# Patient Record
Sex: Female | Born: 1965 | Race: White | Hispanic: No | Marital: Married | Smoking: Never smoker
Health system: Southern US, Community
[De-identification: ages and names within clinical notes are randomized; demographics above are authoritative.]

## PROBLEM LIST (undated history)

## (undated) DIAGNOSIS — K5900 Constipation, unspecified: Secondary | ICD-10-CM

## (undated) DIAGNOSIS — K625 Hemorrhage of anus and rectum: Secondary | ICD-10-CM

## (undated) DIAGNOSIS — R109 Unspecified abdominal pain: Secondary | ICD-10-CM

## (undated) DIAGNOSIS — M6790 Unspecified disorder of synovium and tendon, unspecified site: Secondary | ICD-10-CM

## (undated) HISTORY — DX: Constipation, unspecified: K59.00

## (undated) HISTORY — DX: Unspecified abdominal pain: R10.9

## (undated) HISTORY — DX: Hemorrhage of anus and rectum: K62.5

---

## 2011-02-14 ENCOUNTER — Ambulatory Visit: Payer: Self-pay | Admitting: Orthopedic Surgery

## 2011-02-14 ENCOUNTER — Ambulatory Visit: Payer: Self-pay

## 2011-02-14 NOTE — Progress Notes (Signed)
 CHIEF COMPLAINT:   Left foot pain.    HISTORY OF PRESENT ILLNESS:   The patient is a pleasant 45 year old female  who is very active with marathon training long distance biking who states  she has had left foot pain since this past summer.  The patient completed  activity modification on her own as well as some shoe wear modifications.  Had some resolution of her symptoms; however over the last 2 weeks the  patient has had increase, which is affecting her activities of daily  living.    Past medical history, past surgical history, medications, allergies, social  history, family history, and review of systems are all discussed reviewed  with the patient utilizing the new patient questionnaire. Overall the  patient is very healthy taking nonsteroidals on a p.r.n. basis with no  allergies.    PHYSICAL EXAMINATION:  The patient is alert and oriented appropriately in  no apparent distress.  The patient is approximately 5 feet 6 inches tall  weighing 120 pounds.  Focused exam to the lower extremities notes overall  good standing alignment.  The patient does have some mild cavus.  Sensation  about L4, L4, and S1 intact and symmetrical.  Palpable pulses of DP.  Motor  is 5/5 in all major muscle groups.  Range of motion of the ankle and  subtalar are equal and without pain elicited.  Mild tenderness to palpation  about the middle aspect of the plantar fascia along with significant  tenderness to palpation over the fibular sesamoid on the patient's left  foot is noted.    IMAGING STUDIES:  Review of weightbearing x-rays of the left foot taken the  office today demonstrate no acute fracture, subluxation, or dislocations  noted.  No significant arthritic change.  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    ASSESSMENT:  Fibular sesamoiditis.    PLAN:  The patient has not received any significant treatment for this  condition.  We will start her today with inserts  with posting about that  fibular sesamoid.  I have also briefed the patient about activity  modification, icing as well as nonsteroidal use.  We will have her follow  back up on a p.r.n. basis.  We did discuss with the patient that if she has  an acute exacerbation especially in light of all her training and  activities she currently participates in, consideration for use of a  fracture boot might be appropriate.      Dictated by:  Katherine Basset, MD,FEL      I saw and evaluated the patient.  I agree with the resident's/fellow's  findings and plan of care as documented above.    Electronically Signed and Finalized  by  Halina Andreas, MD 02/15/2011  17:39  ___________________________________________  Halina Andreas, MD      DD:   02/14/2011  DT:   02/14/2011  2:33 P  ZO/[id number]  [id number]    cc:

## 2011-02-20 ENCOUNTER — Ambulatory Visit: Payer: Self-pay

## 2012-06-27 ENCOUNTER — Encounter: Payer: Self-pay | Admitting: Gastroenterology

## 2012-06-27 LAB — HM MAMMOGRAPHY

## 2012-06-27 LAB — HM COLONOSCOPY

## 2012-07-01 ENCOUNTER — Ambulatory Visit: Payer: Self-pay | Admitting: Primary Care

## 2012-07-01 ENCOUNTER — Encounter: Payer: Self-pay | Admitting: Primary Care

## 2012-07-01 VITALS — BP 98/68 | HR 62 | Ht 64.75 in | Wt 119.4 lb

## 2012-07-01 DIAGNOSIS — K921 Melena: Secondary | ICD-10-CM

## 2012-07-01 DIAGNOSIS — R102 Pelvic and perineal pain: Secondary | ICD-10-CM

## 2012-07-01 NOTE — Progress Notes (Signed)
SUBJECTIVE:  New pt.      Having pain after intercourse in her pelvis.  Some bloating. .Lipoma may be growing.  Tends to be constipated; tries to manage it with fiber and water. Has taken MiraLax occasionally.  Has had blood in the stool occasionally in small amounts in the last year.  No identifiable hemorrhoids.      Some increased stress. Manages stress and anxiety with exercise. Denies insomnia.  Mood is mostly good. Denies anhedonia.    Exercises regularly with running. No exertional dyspnea or chest pain. No dizzy spells. No skin rashes. Review of systems otherwise negative.      family history includes Alcohol abuse in her paternal grandfather; Cancer in her maternal aunt; Cancer (age of onset: 20) in her mother; Cancer (age of onset: 3) in her maternal grandmother; Depression in her maternal uncle and mother; and Heart disease (age of onset: 72) in her father.     reports that she has never smoked. She does not have any smokeless tobacco history on file. She reports that  drinks alcohol. She reports that she does not use illicit drugs.            Allergies:  Adhesive tape    Meds:  No current outpatient prescriptions on file.  Medication list reviewed and updated today during the visit.    OBJECTIVE:      BP 98/68  Pulse 62  Ht 1.645 m (5' 4.75")  Wt 54.159 kg (119 lb 6.4 oz)  BMI 20.01 kg/m2  LMP 07/01/2012    Well appearing middle-aged woman in no acute distress. HEENT exam revealed anicteric sclerae, moist mucous membranes, no oral lesions.  Neck is supple without lymphadenopathy or thyromegaly. Lungs are clear to auscultation bilaterally with easy respirations. Normal S1-S2 at a regular rate and rhythm without murmurs rubs or gallops. JVP is less than 7 cm above the right atrium.  Abdomen soft and nontender nondistended. Extremities are without clubbing cyanosis or edema. Skin is warm and dry without rashes. Affect is normal. Speech is normal.  Rectal exam reveals no external masses. Brown stool in  the vault. No internal masses. Possible internal hemorrhoids palpable.    ASSESSMENT and PLAN:  1.  Pelvic pain: Not classically dyspareunia antibiotics or after intercourse not during. Check pelvic ultrasound to evaluate for fibroids. Schedule routine pelvic exam with Pap smear. Could be irritable bowel syndrome given constipation symptoms and bloating. Discussed stress management and dietary changes. Consider addition of fiber supplement.    2. Rectal bleeding: Likely hemorrhoidal but other pathology needs to be ruled out. Referred for colonoscopy. Possibly irritable bowel syndrome as discussed above.          Signed: Virgel Manifold, MD on 07/01/2012 at 11:44 AM  [facility]

## 2012-07-01 NOTE — Patient Instructions (Addendum)
Try Konsyl powder daily in water.  Add miralax if that's not enough to improve you bloating and constipation.      [facility]- [address]   Ph#: [phone]   Date: Mon 07/08/12  Time: 12:45pm  *32 ounces water 1hr before, hold.

## 2012-07-02 ENCOUNTER — Encounter: Payer: Self-pay | Admitting: Primary Care

## 2012-07-23 ENCOUNTER — Telehealth: Payer: Self-pay | Admitting: Primary Care

## 2012-07-23 NOTE — Telephone Encounter (Signed)
Pt calling back re: u/s results. Also, her mother had cancerous polyps removed at the age of 47yrs old (referring to colonoscopy referral). Thanks!

## 2012-08-02 ENCOUNTER — Telehealth: Payer: Self-pay | Admitting: Primary Care

## 2012-08-02 DIAGNOSIS — M79673 Pain in unspecified foot: Secondary | ICD-10-CM

## 2012-08-02 NOTE — Telephone Encounter (Signed)
Pt needs a orthotics RX for Lyncos. Thank you!  Fax #: [phone]

## 2012-08-05 NOTE — Telephone Encounter (Signed)
Yes- lyncos is a specific type pf foot support. Orthotics asked for a rx for them. Thanks!

## 2012-08-05 NOTE — Telephone Encounter (Signed)
Notified Pt to contact [facility].

## 2012-08-07 NOTE — Telephone Encounter (Signed)
Will sign and put in my outbox

## 2012-08-08 NOTE — Telephone Encounter (Signed)
Nicholaus Bloom sent Wed 10/16

## 2012-08-09 ENCOUNTER — Ambulatory Visit: Payer: Self-pay

## 2012-08-09 DIAGNOSIS — M79673 Pain in unspecified foot: Secondary | ICD-10-CM

## 2012-08-09 NOTE — Progress Notes (Signed)
Received 1 pr new [place] inserts.

## 2012-08-12 ENCOUNTER — Encounter: Payer: Self-pay | Admitting: Primary Care

## 2012-08-12 ENCOUNTER — Ambulatory Visit: Payer: Self-pay | Admitting: Primary Care

## 2012-08-12 VITALS — Ht 64.75 in | Wt 118.4 lb

## 2012-08-12 DIAGNOSIS — Z01419 Encounter for gynecological examination (general) (routine) without abnormal findings: Secondary | ICD-10-CM

## 2012-08-12 DIAGNOSIS — Z139 Encounter for screening, unspecified: Secondary | ICD-10-CM

## 2012-08-12 DIAGNOSIS — Z23 Encounter for immunization: Secondary | ICD-10-CM

## 2012-08-13 NOTE — H&P (Signed)
History and Physical    HISTORY:  Chief Complaint   Patient presents with   . Gynecologic Exam     looking for GYN         History of Present Illness:    HPI Comments: Feeling better. Pelvic pain has significantly improved with attention to constipation with increased exercise and water intake. Taking fiber supplements intermittently. Periods are pretty regular. No excessive bleeding. No bleeding in between periods. No vaginal discharge. No new sexual partners. Has a history of regular normal Pap smears. Last one was about 3 years ago before she moved here.    Gynecologic Exam  Pertinent negatives include no abdominal pain, constipation, diarrhea, nausea or vomiting.       Problems:  There is no problem list on file for this patient.       Past Medical/Surgical History:   No past medical history on file.  Past Surgical History   Procedure Laterality Date   . Cesarean section, classic       once, for twins   . Pelvic laparoscopy       for infertility         Allergies:    Allergies   Allergen Reactions   . Adhesive Tape Rash       Current medications:    No current outpatient prescriptions on file.       Family History:    Family History   Problem Relation Age of Onset   . Alcohol abuse Paternal Grandfather    . Cancer Mother 74     colon polyps--possibly   . Depression Mother    . Heart disease Father 57     smoker   . Cancer Maternal Aunt      colon polyps   . Depression Maternal Uncle      suicide   . Cancer Maternal Grandmother 6     fatal colon cancer       Social/Occupational History:   History     Social History   . Marital Status: Married     Spouse Name: N/A     Number of Children: N/A   . Years of Education: N/A     Social History Main Topics   . Smoking status: Never Smoker    . Smokeless tobacco: Never Used   . Alcohol Use: Yes      occasional   . Drug Use: No   . Sexually Active: Not on file     Other Topics Concern   . Not on file     Social History Narrative    Homemaker and active volunteer     Married with 3 children         Review of Systems:    Review of Systems   Constitutional: Negative.    HENT: Negative.    Eyes: Negative.    Respiratory: Negative.    Cardiovascular: Negative.    Gastrointestinal: Positive for blood in stool. Negative for heartburn, nausea, vomiting, abdominal pain, diarrhea, constipation and melena.        Occasional bright blood in the stool. Occurred the other day after running 17 miles. Constipation has improved with increased water and exercise.   Genitourinary: Negative.    Musculoskeletal: Negative.    Skin: Negative.    Neurological: Negative.    Endo/Heme/Allergies: Negative.    Psychiatric/Behavioral: Negative.        Vital Signs:   Ht 1.645 m (5' 4.75")  Wt 53.706 kg (118 lb 6.4  oz)  BMI 19.85 kg/m2  LMP 07/28/2012      PHYSICAL EXAM:  Physical Exam   Constitutional: She is oriented to person, place, and time. She appears well-developed and well-nourished. No distress.   HENT:   Head: Normocephalic and atraumatic.   Right Ear: External ear normal.   Left Ear: External ear normal.   Nose: Nose normal.   Mouth/Throat: Oropharynx is clear and moist. No oropharyngeal exudate.   Hearing intact to finger rubbing bilaterally   Eyes: Conjunctivae and EOM are normal. Pupils are equal, round, and reactive to light. No scleral icterus.   Neck: Normal range of motion. Neck supple. No JVD present. No tracheal deviation present. No thyromegaly present.   Cardiovascular: Normal rate, regular rhythm, normal heart sounds and intact distal pulses.  Exam reveals no gallop and no friction rub.    No murmur heard.  Pulmonary/Chest: Effort normal and breath sounds normal. No respiratory distress. She has no wheezes. She has no rales.   Breast exam normal.   Abdominal: Soft. Bowel sounds are normal. She exhibits no distension and no mass. There is no tenderness. There is no rebound and no guarding.   Genitourinary: Vagina normal and uterus normal. No vaginal discharge found.   Cervix appears  normal.   Musculoskeletal: She exhibits no edema.   Lymphadenopathy:     She has no cervical adenopathy.   Neurological: She is alert and oriented to person, place, and time. No cranial nerve deficit. She exhibits normal muscle tone. Coordination normal.   Babinski downgoing bilaterally  Vibration testing normal in the distal toes bilaterally.   Skin: Skin is warm and dry. No rash noted.   Psychiatric: She has a normal mood and affect. Her behavior is normal. Judgment and thought content normal.           Assessment:    Nyomii was seen today for gynecologic exam.    Diagnoses and associated orders for this visit:    Pap test, as part of routine gynecological examination  - GYN Cytology; Future  - GYN Cytology    Need for vaccination  - Flu vaccine Trivalent greater than or equal to 3yo with preservative IM (AMBULATORY USE ONLY)    Screening  - HIV 1&2 antigen/antibody; Future  - Lipid panel; Future    Rectal bleeding: Followup with GI. Likely related to internal hemorrhoids. However, given history of adenocarcinoma and a polyp in her mother, recommended colonoscopy.     .      Plan:        Immunizations discussed and updated.    Health care proxy reviewed.    Colon cancer screening discussed.    GYN care discussed.    Mammogram screening discussed.    Regular ophthalmology and dental care discussed.    HIV screening discussed.    Cardiovascular disease prevention discussed.    Osteoporosis screening and prevention discussed.

## 2012-08-19 LAB — HPV DNA PROBE WITH CYTOLOGY: HPV Hybrid Capture: NEGATIVE

## 2012-08-21 LAB — GYN CYTOLOGY

## 2012-09-12 ENCOUNTER — Encounter: Payer: Self-pay | Admitting: Gastroenterology

## 2012-09-25 ENCOUNTER — Encounter: Payer: Self-pay | Admitting: Gastroenterology

## 2012-09-30 ENCOUNTER — Ambulatory Visit
Admit: 2012-09-30 | Disposition: A | Discharge status: Home / Self Care | Payer: Self-pay | Source: Ambulatory Visit | Attending: Gastroenterology | Admitting: Gastroenterology

## 2012-11-15 ENCOUNTER — Encounter: Payer: Self-pay | Admitting: Gastroenterology

## 2012-11-20 ENCOUNTER — Telehealth: Payer: Self-pay

## 2012-11-20 NOTE — Telephone Encounter (Signed)
Called to remind patient of 11/21/2012 [facility] appointment however voicemail full at telephone number provided.

## 2012-11-21 ENCOUNTER — Ambulatory Visit
Admit: 2012-11-21 | Disposition: A | Discharge status: Home / Self Care | Payer: Self-pay | Source: Ambulatory Visit | Attending: Gastroenterology | Admitting: Gastroenterology

## 2012-11-21 ENCOUNTER — Encounter: Payer: Self-pay | Admitting: Gastroenterology

## 2012-11-21 MED ORDER — SODIUM CHLORIDE 0.9 % IV SOLN WRAPPED *I*
50.0000 mL/h | Status: DC
Start: 2012-11-21 — End: 2012-11-21
  Administered 2012-11-21 (×2): 50 mL/h via INTRAVENOUS

## 2012-11-21 MED ORDER — FENTANYL CITRATE 50 MCG/ML IJ SOLN *WRAPPED*
INTRAMUSCULAR | Status: AC | PRN
Start: 2012-11-21 — End: 2012-11-21
  Administered 2012-11-21: 75 ug via INTRAVENOUS
  Administered 2012-11-21: 25 ug via INTRAVENOUS

## 2012-11-21 MED ORDER — MIDAZOLAM HCL 5 MG/5ML IJ SOLN *I*
INTRAMUSCULAR | Status: AC
Start: 2012-11-21 — End: 2012-11-21
  Filled 2012-11-21: qty 5

## 2012-11-21 MED ORDER — FENTANYL CITRATE 50 MCG/ML IJ SOLN *WRAPPED*
INTRAMUSCULAR | Status: AC
Start: 2012-11-21 — End: 2012-11-21
  Filled 2012-11-21: qty 2

## 2012-11-21 MED ORDER — MIDAZOLAM HCL 1 MG/ML IJ SOLN *I* WRAPPED
INTRAMUSCULAR | Status: AC | PRN
Start: 2012-11-21 — End: 2012-11-21
  Administered 2012-11-21 (×2): 2 mg via INTRAVENOUS
  Administered 2012-11-21: 1 mg via INTRAVENOUS

## 2012-11-21 NOTE — Discharge Instructions (Signed)
[facility]  Discharge Instructions For Colonoscopy    11/21/2012    4:17 PM    Findings: Removal of two good-sized polyps, hemorrhoids    Do not drive, operate heavy machinery, drink alcoholic beverages, make important personal or business decisions, or sign legal documents until the next day.     Instructions:   Follow a high fiber diet.    Things You May Expect:   There may be a small amount of bright red blood in your stool.   It may be a few days before you have a bowel movement.   You may have cramping, bloating, and a "gas" feeling. These feelings should go away as you pass gas. If you still feel uncomfortable, walking around will help to pass the gas.   You were given medication to help you relax during the test. You may feel "fuzzy" and drowsy. Go home and rest for at least 4-6 hours.    You Should Call Your Doctor For Any of the Following:     Bad stomach pain.    Fever.    Bright red bleeding or clots (This may happen up to 2 weeks after the test).    Dizziness or weakness that gets worse or lasts up to 24 hours.    Pain or redness at the IV site.    If you have a serious problem after hours, Call Dr. Debroah Baller [phone] to reach the GI physician on call. If you are unable to reach your doctor, go to the [facility].    Follow Up Care:  If biopsies were taken during your procedure, we will send you the pathology results within 7-10 days. If you do not receive your pathology results after 10 days please call Dr. Debroah Baller [phone].  Report will be sent to your primary doctor.    Repeat exam in  2 year(s).    HIGH FIBER DIET    Why follow a high-fiber diet?  By incorporating a high-fiber diet into your lifestyle, you can help prevent and treat constipation.  For some people, it may also help to lower blood cholesterol.  A high-fiber diet provides 20 grams - 35 grams of fiber per day.    IMPORTANT POINTS TO KEEP IN MIND  The foods that supply  the most fiber are : whole-grain breads and cereals, grains, fruits, and vegetables.    As you add more fiber to your diet: Do it gradually - too much fiber added too quickly may cause gas, cramping, bloating, or diarrhea.    Drink plenty of fluids----at least 8 cups every day.    SAMPLE MENU FOR A HIGH-FIBER DIET    BREAKFAST  Orange juice (3/4 cup)  Oatmeal * (1cup) with raisins* (2 tablespoons)  Whole-grain toast* (2 slices) with margarine (2 teaspoons) and jam (2 teaspoons)  Milk (1cup)  Coffee or tea    LUNCH  Split pea soup* (1cup)  with whole-wheat crackers* (4)    Hamburger (3 oz) on a whole-grain bun* with mustard, ketchup, tomato, mustard, onion, and lettuce  Fresh fruit salad* (1/2) cup  Ice water    SNACK  Bran muffin* (1)  Pear* (1 medium)  Milk (1cup)    DINNER  Tossed salad  (1 cup) with vinegar and oil dressing (1tbsp)  Broiled skinless chicken breast (3oz)  Herbed brown rice* (1/2) cup  Steamed broccoli * (1/2 cup)  Whole-grain roll* (1) with margarine (1tsp)  Low-fat frozen yogurt (1/2 cup)  Coffee or tea    * Good source of dietary fiber

## 2012-11-21 NOTE — Preop H&P (Signed)
OUTPATIENT PRE-PROCEDURE H&P    Chief Complaint / Indications for Procedure: bleed    Past Medical History:     Past Medical History   Diagnosis Date   . Abdominal pain    . Constipation    . Rectal bleeding      Past Surgical History   Procedure Laterality Date   . Cesarean section, classic       once, for twins   . Pelvic laparoscopy       for infertility     Family History   Problem Relation Age of Onset   . Alcohol abuse Paternal Grandfather    . Cancer Mother 15     colon polyps--possibly   . Depression Mother    . Heart disease Father 53     smoker   . Cancer Maternal Aunt      colon polyps   . Depression Maternal Uncle      suicide   . Cancer Maternal Grandmother 47     fatal colon cancer     History     Social History   . Marital Status: Married     Spouse Name: N/A     Number of Children: N/A   . Years of Education: N/A     Social History Main Topics   . Smoking status: Never Smoker    . Smokeless tobacco: Never Used   . Alcohol Use: Yes      Comment: occasional   . Drug Use: No   . Sexually Active: None     Other Topics Concern   . None     Social History Narrative    Homemaker and active volunteer    Married with 3 children       Allergies:    Allergies   Allergen Reactions   . Adhesive Tape Rash       Medications:  Current Facility-Administered Medications   Medication Dose Route Frequency   . sodium chloride IV  50 mL/hr Intravenous Continuous      Filed Vitals:    11/21/12 1539   BP: 107/64   Pulse: 59   Temp:    Resp: 17   Height:    Weight:        ROS    Physical Examination:  Head/Nose/Throat:negative  Lungs:Lungs clear  Cardiovascular:normal S1 and S2  Abdomen: abdomen soft, non-tender, nondistended, normal active bowel sounds, no masses or organomegaly      Lab Results: none    Radiology impressions (last 30 days):  No results found.    Currently Active Problems:  There is no problem list on file for this patient.         UPDATES TO PATIENT'S CONDITION on the DAY OF SURGERY/PROCEDURE    I. Updates  to Patient's Condition (to be completed by a provider privileged to complete a H&P, following reassessment of the patient by the provider):    Full H&P done today; no updates needed.    II. Procedure Readiness   I have reviewed the patient's H&P and updated condition. By completing and signing this form, I attest that this patient is ready for surgery/procedure.      III. Attestation   I have reviewed the updated information regarding the patient's condition and it is appropriate to proceed with the planned surgery/procedure.    Debroah Baller, MD as of 3:42 PM 11/21/2012

## 2012-11-21 NOTE — Procedures (Signed)
Procedure Report      Colonoscopy Procedure Note     Date of Procedure: 11/21/2012   Primary Physician: Virgel Manifold, MD        Attending Physician: Debroah Baller MD      Indications: hematochezia, family history of colon cancer    Medications:Fentanyl 100 mcg IV and Midazolam 5 mg IV were administered incrementally over the course of the procedure to achieve an adequate level of conscious sedation.     Informed Consent: Prior to the procedure, the patient was explained the risk, benefits and alternatives including the risk of bleeding, infection or perforation and informed consent was obtained.     Procedure Details: The patient was placed in the left lateral decubitus position and monitored continuously with ECG tracing, pulse oximetry, blood pressure monitoring and direct observations. After anorectal examination was performed, the Olympus CF-H180was inserted into the rectum and advanced under direct vision to the cecum, which was identified by  the ileocecal valve and the appendiceal orifice. The procedure was considered not difficult..    Narrative:  During withdrawal examination, the final quality of the prep was [facility] Bowel Prep Scale   Prep: Grade2- (minor amount of residual staining, small fragments of stool, and/or opaque liquid, but mucosa of colon segment is seen well)    A careful inspection was made as the colonoscope was withdrawn, a retroflexed view of the rectum was included; findings and interventions are described below. Appropriate photo documentation wasobtained. The patient  recovered in the [facility].    Findings:     Terminal Ileum: nml    Colon: DRE was normal.  The cecum and Ic valve were normal.  The right and transverse colon were normal.  The left colon was normal.  At 30cm, a 8mm polyp was removed with the cold snare.  At 20cm, a 7mm polyp was removed with the cold snare.  Small internal hemorrhoids were seen      Complications: none    Impression: 1. Removal of two polyp  with cold snare  2. Hemorrhoids    Recommendations: 1. Await pathology  2. Given her family history and two good-sized polyps removed on todays exam, I have recommended a repeat exam in 2 years time.  High fiber diet    Debroah Baller, MD  Cell: [phone]  Office Phone # [phone]

## 2012-11-25 LAB — SURGICAL PATHOLOGY

## 2012-12-05 ENCOUNTER — Encounter: Payer: Self-pay | Admitting: Orthopedic Surgery

## 2012-12-05 ENCOUNTER — Ambulatory Visit: Payer: Self-pay | Admitting: Orthopedic Surgery

## 2012-12-05 VITALS — BP 102/51 | Ht 65.5 in | Wt 115.0 lb

## 2012-12-05 DIAGNOSIS — M25559 Pain in unspecified hip: Secondary | ICD-10-CM

## 2012-12-05 DIAGNOSIS — R52 Pain, unspecified: Secondary | ICD-10-CM

## 2012-12-05 NOTE — Progress Notes (Signed)
Megan Sanders MR #:  [id number]   ACCOUNT #:  [id number] DOB:  [birth year]   DICTATED BY:  Chaya Jan, PA DATE OF VISIT:  12/05/2012     CHIEF COMPLAINT:  Left hip pain.    HISTORY OF PRESENT ILLNESS:  Megan Sanders states that on Sunday, she was doing some snowshoeing.  She just decided that she would run down afterwards.  She states the next morning, she woke up, she was a little bit sore, she went to a spinning class.  She states after this, she had a lot of pain of the lateral aspect of the hip.  She denies any pain radiating down the legs.  She denies any numbness or tingling.  She denies any snapping or popping.  She denies any abdominal pain.  She states that she has been running marathons for the past 2 or 3 years and has had multiple issues with this left side throughout the last couple years.  She states she is tender laterally.  She was seen by a physical therapist this morning who started some light stim and stretching exercise.  She states this did seem to help.  She is here today to discuss further treatment options.  She is currently not taking any anti-inflammatories.  She has not undergone any injections.  She is rating her pain about a 6/10.     Past medical, surgical, family, social history, medications, allergies, and review of systems were reviewed with the patient today and available per new patient questionnaire.    MEDICAL HISTORY:  None.    MEDICATIONS:  None.    SURGICAL HISTORY:  Hysterectomy in 2000, C-section 2002.    ALLERGIES:  Adhesive tape.    PHYSICAL EXAMINATION:  Megan Sanders is a 47 year old female alert and oriented x3.  Pleasant mood and affect, appears in no acute distress.  SKIN:  Cool and dry to touch.  No laceration, abrasions or lesions.  She is ambulating with a nonantalgic gait.  She is normocephalic, atraumatic with normal range of motion of cervical spine.  Negative head compression test, negative Spurling's.  No upper motor neuron signs.  Distal pulses intact  in both upper and lower extremities with full range of motion of both upper extremities.  ABDOMEN:  Soft, nontender.  Pelvis stable.  No pain in pubic symphysis or ASIS.  She does have some peritrochanteric pain and deep gluteal pain to palpation that seems to be consistent in the piriformis and the greater trochanter.  She can flex the hip to 130 degrees, internal rotation to 30, external rotation to 60.  She has a negative FADIR, symmetrical FABER, negative log roll.  Negative straight-leg raise.  She can flex, abduct and adduct the hip against resistance with good strength.  She does have pain with resisted abduction.  Calves are soft, nontender bilaterally.  Distal neurovascular exam is intact.    IMAGING:  X-rays were obtained and personally reviewed by me showing maintained joint space.  She does have since some calcification over the greater trochanter on the left side compared to the right and calcification off the acetabulum as well.    ASSESSMENT/PLAN:  Megan Sanders is a 47 year old female here with gluteus medius and minimus tendinopathy.  At this time, I would like her to continue with physical therapy.  She states that she does have a run coming up in the next 6 weeks or so and she is looking to resolve this as quick as possible and would like  to discuss cortisone injection as well.  We discussed the risks and benefits of this, and she states that she would like to proceed.  She will call our office to set this up at her convenience.  We will do a trochanteric bursal injection under ultrasound guidance.  Ultrasound will be used for increased accuracy of the injection with needle verification.  All questions were invited and answered to the patient's satisfaction.  She will follow up with [place] once this injection has been scheduled.  If she has any further questions or concerns, she can feel free to contact the office.  She will continue anti-inflammatories at this time.       Dictated By:  Chaya Jan,  PA      ______________________________  Arvil Persons, MD    DGK/MODL  DD:  12/05/2012 09:23:36  DT:  12/05/2012 09:50:39  Job #:  [id number]    cc: Arvil Persons, MD

## 2012-12-05 NOTE — Progress Notes (Signed)
Dictated

## 2012-12-07 ENCOUNTER — Encounter: Payer: Self-pay | Admitting: Orthopedic Surgery

## 2012-12-07 ENCOUNTER — Ambulatory Visit: Payer: Self-pay | Admitting: Orthopedic Surgery

## 2012-12-07 VITALS — BP 113/54 | HR 56 | Ht 65.5 in | Wt 115.0 lb

## 2012-12-07 DIAGNOSIS — M25559 Pain in unspecified hip: Secondary | ICD-10-CM

## 2012-12-07 NOTE — Progress Notes (Signed)
Dictated

## 2012-12-07 NOTE — Progress Notes (Signed)
PATIENTSKYELA, Megan Sanders MR #:  [id number]   ACCOUNT #:  [id number] DOB:  [birth year]   DICTATED BY:  Chaya Jan, PA DATE OF VISIT:  12/07/2012     CHIEF COMPLAINT:  Ongoing left hip pain.    INTERVAL HISTORY:  Megan Sanders is here today stating that her lateral based hip pain seems to be resolving.  She has been working with a physical therapist doing some massage technique.  She has also taken the last couple days off from any activity, and started the anti-inflammatory that was recommended.  She states she has had much improvement since then.  She was originally scheduled today for a trochanteric bursal injection.  She would like to discuss whether or not she should move forward with this.    PHYSICAL EXAMINATION:  Megan Sanders is a 47 year old female, alert and oriented x3.  Pleasant mood and affect, appears in no acute distress.  Skin is cool and dry to touch.  No lacerations, abrasions, lesions.  She is ambulating with a nonantalgic gait.  Abdomen is soft and nontender.  Pelvis is stable.  She has no pain over the greater trochanteric region today.  She does have some pain to palpation over the piriformis.  She can flex to 120 degrees, internal and external rotation unchanged from previous visit;  however, is nonpainful for her today.  She can flex, abduct and adduct the hip against resistance with good strength, no pain.  Calves are soft, nontender.  Distal neurovascular exam is intact.    ASSESSMENT/PLAN:  Megan Sanders is a 47 year old female here with resolving lateral hip pain.  At this time I do not believe a trochanteric bursal injection under ultrasound guidance will be an appropriate step.  I think she should continue with her more conservative treatments as this pain will be self-resolving.  Continue with a good stretching program with physical therapy and slowly progressing back to normal activities.  We did discuss the nature of this pain and the source from spinning and snow shoeing.  She will follow up with  [place] on an as-needed basis.       Dictated By:  Chaya Jan, PA      ______________________________  Arvil Persons, MD    DGK/MODL  DD:  12/07/2012 08:44:49  DT:  12/07/2012 09:10:35  Job #:  [id number]    cc: Arvil Persons, MD

## 2012-12-12 ENCOUNTER — Ambulatory Visit: Payer: Self-pay | Admitting: Neurology

## 2012-12-13 ENCOUNTER — Ambulatory Visit: Payer: Self-pay | Admitting: Neurology

## 2012-12-13 ENCOUNTER — Encounter: Payer: Self-pay | Admitting: Neurology

## 2012-12-13 VITALS — BP 100/70 | HR 55 | Temp 99.7°F | Resp 12 | Wt 117.2 lb

## 2012-12-13 DIAGNOSIS — J329 Chronic sinusitis, unspecified: Secondary | ICD-10-CM

## 2012-12-13 DIAGNOSIS — J310 Chronic rhinitis: Secondary | ICD-10-CM

## 2012-12-13 DIAGNOSIS — J029 Acute pharyngitis, unspecified: Secondary | ICD-10-CM

## 2012-12-13 DIAGNOSIS — R5383 Other fatigue: Secondary | ICD-10-CM

## 2012-12-13 LAB — POCT AMBULATORY RAPID STREP: Lot #: 131211

## 2012-12-13 MED ORDER — AZITHROMYCIN 250 MG PO TABS *I*
ORAL_TABLET | ORAL | Status: AC
Start: 2012-12-13 — End: 2012-12-18

## 2012-12-13 NOTE — Progress Notes (Signed)
S:  Developed sore throat 4 days ago.  Hurt to swallow.  The following day noted increased PND, bilateral ear fullness and semi productive cough.  Endorses fatigue.  Denies fever.  Has taken isolated doses of analgesics and Mucinex as needed.     Medication regime reconciled.    O:  Blood pressure 100/70, pulse 55, temperature 37.6 C (99.7 F), temperature source Temporal, resp. rate 12, weight 53.162 kg (117 lb 3.2 oz), last menstrual period 12/02/2012, SpO2 98.00%. appears comfortable.  Sounds congested.  TMs clear.  No erythema.  No facial tenderness.  O/P 1000 injected.  Cobblestoning.  No patchy exudate.  Neck supple.  No lymphadenopathy.  Lungs clear.    Impression/plan:     1/  Pharyngitis.       Rhinosinusitis.  Rapid strep throat culture negative.  Lab culture pending.  Most probably viral.  Encouraged her to continue conservative management-expectorant, analgesics, humidified air.  Provided with a prescription for azithromycin to begin if symptoms persist or worsen over the next 7-10 days.    2/  Health maintenance.  Fasting lab work pending, including lipid profile, TSH and vitamin D.    Requesting STD/HIV  testing.  Informed consent signed.    FU with Dr. Nunzio Cory as scheduled.

## 2012-12-15 LAB — STREP A CULTURE, THROAT: Group A Strep Throat Culture: 0

## 2013-02-03 ENCOUNTER — Encounter: Payer: Self-pay | Admitting: Orthopedic Surgery

## 2013-02-03 ENCOUNTER — Ambulatory Visit: Payer: Self-pay | Admitting: Orthopedic Surgery

## 2013-02-03 ENCOUNTER — Ambulatory Visit: Payer: Self-pay

## 2013-02-03 VITALS — BP 109/66 | HR 56 | Ht 65.5 in | Wt 117.0 lb

## 2013-02-03 DIAGNOSIS — M8430XA Stress fracture, unspecified site, initial encounter for fracture: Secondary | ICD-10-CM

## 2013-02-03 DIAGNOSIS — M25579 Pain in unspecified ankle and joints of unspecified foot: Secondary | ICD-10-CM

## 2013-02-03 DIAGNOSIS — M79673 Pain in unspecified foot: Secondary | ICD-10-CM

## 2013-02-03 NOTE — Progress Notes (Signed)
Walk-in form    Patient name: Megan Sanders     Pain    02/03/13 1714   PainSc:   2       Laterality: right    Device: Low Boot             Functional Goals: Foot support    ROM settings: Not Applicable    Expected Frequency / Duration of Use:   TBD    Brand: Procare                 Model: Maxtrax    Size: Medium    Modifications made: Not Applicable    Complexity:   x Fitting was routine, requiring little to no adjustments      x Device was inspected for safety and security and found to be functioning properly    Ordered/Delivered: Delivered    The patient given verbal and written instructions.  The following Instructions were reviewed:   xPurpose of device   xCleaning / care of device   xPotential Risks / Benefits   xFrequency / Duration of use   xHow to report potential failure / malfunctions

## 2013-02-03 NOTE — Patient Instructions (Signed)
[facility]      Your physician has determined that you require Prefabricated (off-the-shelf) Orthotic and Prosthetic (O&P) devices and/or Durable Medical Equipment (DME).  O&P devices include items such as braces for the spine or limbs, while DME includes items such as canes, crutches and walkers.  For your convenience you were fitted by the [facility].    Return Policy:    Prefabricated O&P and DME items are not returnable if used outside of clinic due to hygiene concerns.    Special or custom orders are not returnable or refundable.    O&P devices and DME purchased from the [facility] may only be exchanged if the item is faulty or poorly fitting, as determined by the O&P Department Clinical Coordinator.  Exchanges will be made using the same type of device, if within 7 days of the purchase date.    No exchange will be issued for items that were not used in accordance with the manufacturer’s recommended standard of care.    Replacement or repair of minor parts could become necessary due to wear.  This may include a charge and an order from you physician may be required.

## 2013-02-04 ENCOUNTER — Encounter: Payer: Self-pay | Admitting: Orthopedic Surgery

## 2013-02-04 DIAGNOSIS — M25579 Pain in unspecified ankle and joints of unspecified foot: Secondary | ICD-10-CM | POA: Insufficient documentation

## 2013-02-04 DIAGNOSIS — M8430XA Stress fracture, unspecified site, initial encounter for fracture: Secondary | ICD-10-CM | POA: Insufficient documentation

## 2013-02-04 NOTE — Progress Notes (Signed)
.      Orthopaedic Visit:   New Problem  Megan Sanders, MRN: [id number]  Date of visit: 02/04/2013    CC: Right foot pain    Subjective:     Date of visit: 02/04/2013  Symptoms duration:  1 week  Injury:  No known.  Patient is an avid runner  Made worse by:  Increased activity  Made better by:  Rest, ice, accommodative footwear.  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      Objective:    Alert and oriented x 3.  Pleasant, cooperative, appropriate.  Right and Left lower extremities: Palpable pulses, neurovascularly intact.    Calf soft and nontender.  Swelling:  Minimal  Tenderness:  Upon palpation she is tender over the right fourth metatarsal  ROM:  Intact  Strength:  5/5       Imaging studies:   Right foot x-rays reviewed and no distinct fracture noted at this time      Impression:  47 y.o. female, with right fourth metatarsal pain-stress reaction no definite crack in the bone in the x-rays noted at this time.   The patient is a marathon runner, advised her not to participate in a marathon she was planning to run in 2 weeks, if      Plan:    1. Immobilization:low boot  2. Weight bearing status:WB  3. DVT prophylaxis:None  4. Anti-inflammatory / Pain medication:   OTC and icing as needed  5. Orthotics:    6. Therapy:    7. Exercise:  Low-impact   8. Bone Health: Vitamin D3: 4000 units daily             Calcium:   Obtained from diet   9. Work Status: Patient working - Yes.    10. Follow - up: 2-3 of week(s).     For the next visit:  Follow-Up Needs / I am Ordering:  1. X-rays next visit: foot; WB -x-rays ordered.  2. Room:  3. Cast:      And

## 2013-02-21 ENCOUNTER — Telehealth: Payer: Self-pay | Admitting: Orthopedic Surgery

## 2013-02-21 DIAGNOSIS — M25579 Pain in unspecified ankle and joints of unspecified foot: Secondary | ICD-10-CM

## 2013-02-21 NOTE — Telephone Encounter (Signed)
X-ray ordered.

## 2013-02-24 ENCOUNTER — Ambulatory Visit: Payer: Self-pay | Admitting: Orthopedic Surgery

## 2013-02-24 ENCOUNTER — Encounter: Payer: Self-pay | Admitting: Orthopedic Surgery

## 2013-02-24 VITALS — BP 102/66 | Ht 65.5 in | Wt 117.0 lb

## 2013-02-24 DIAGNOSIS — M25579 Pain in unspecified ankle and joints of unspecified foot: Secondary | ICD-10-CM

## 2013-02-24 DIAGNOSIS — M8430XA Stress fracture, unspecified site, initial encounter for fracture: Secondary | ICD-10-CM

## 2013-02-24 NOTE — Progress Notes (Signed)
.      Orthopaedic Visit:   New Problem  Megan Sanders, MRN: [id number]  Date of visit: 02/24/2013    CC: Right foot pain    Subjective:   02/24/2013: Patient returns for followup today.  She states her foot  Is a lot less painful.  She noticed as she was less active and had her foot in the boot  The pain did subside.    Date of visit: 02/03/2013   Symptoms duration:  1 week  Injury:  No known.  Patient is an avid runner  Made worse by:  Increased activity  Made better by:  Rest, ice, accommodative footwear.  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      Objective:    Alert and oriented x 3.  Pleasant, cooperative, appropriate.  Right and Left lower extremities: Palpable pulses, neurovascularly intact.    Calf soft and nontender.  Swelling:  None  Tenderness:  Upon palpation she is nontender over the right fourth metatarsal  ROM:  Intact  Strength:  5/5       Imaging studies:   Right foot x-rays reviewed and no distinct fracture noted at this time, no periosteal reaction seen      Impression:  47 y.o. female, with right fourth metatarsal pain-stress reaction no definite crack in the bone in the x-rays noted at this time.         Plan:    1. Immobilization:low boot-in this next week she can transition into her own foot wear.  2. Weight bearing status:WB  3. DVT prophylaxis:None  4. Anti-inflammatory / Pain medication:   OTC and icing as needed  5. Orthotics:    6. Therapy:    7. Exercise:  Low-impact   8. Bone Health: Vitamin D3: 4000 units daily             Calcium:   Obtained from diet   9. Work Status: Patient working - Yes.    10. Follow - up: 6 weeks     For the next  visit:  Follow-Up Needs / I am Ordering:  1. X-rays next visit:None  2. Room:  3. Cast:

## 2013-04-03 ENCOUNTER — Ambulatory Visit: Payer: Self-pay

## 2013-04-04 ENCOUNTER — Ambulatory Visit: Payer: Self-pay

## 2013-04-04 DIAGNOSIS — Z23 Encounter for immunization: Secondary | ICD-10-CM

## 2013-04-04 NOTE — Progress Notes (Addendum)
Pt tolerated injection.  Pt was bitten by a chipmunk brought in by the cat on her RIGHT thumb.  Pt is contacting [facility] of health, TDAP was updated.  She will contact us if any signs of infection.

## 2013-04-09 ENCOUNTER — Ambulatory Visit: Payer: Self-pay | Admitting: Orthopedic Surgery

## 2013-05-14 ENCOUNTER — Encounter: Payer: Self-pay | Admitting: Gastroenterology

## 2013-05-23 ENCOUNTER — Encounter: Payer: Self-pay | Admitting: Gastroenterology

## 2013-08-13 ENCOUNTER — Ambulatory Visit: Payer: Self-pay | Admitting: Primary Care

## 2013-08-13 ENCOUNTER — Encounter: Payer: Self-pay | Admitting: Primary Care

## 2013-08-13 VITALS — BP 96/56 | HR 68 | Ht 64.75 in | Wt 122.4 lb

## 2013-08-13 DIAGNOSIS — D179 Benign lipomatous neoplasm, unspecified: Secondary | ICD-10-CM

## 2013-08-13 DIAGNOSIS — Z139 Encounter for screening, unspecified: Secondary | ICD-10-CM

## 2013-08-13 LAB — HM HIV SCREENING OFFERED

## 2013-08-13 NOTE — Patient Instructions (Signed)
Use nasal saline spray 2 squirts in each nostril 3-4 times per day ([place]).

## 2013-08-15 NOTE — H&P (Signed)
History and Physical    HISTORY:  Chief Complaint   Patient presents with   . Annual Exam         History of Present Illness:    HPI Comments: Overall, has been feeling well. Still frustrated with intermittent right dorsal foot pain, but has been running relatively regularly. Plans to run the [facility] next year. stress level is somewhat high, but she is coping relatively well.  Mood is mostly good.    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 She would like to meet with a plastic surgeon to have it removed.      Problems:  Patient Active Problem List   Diagnosis Code   . Right  fourth metatarsal Stress reaction of the bone 733.95   . Pain in joint, ankle and foot 719.47        Past Medical/Surgical History:   Past Medical History   Diagnosis Date   . Abdominal pain    . Constipation    . Rectal bleeding      Past Surgical History   Procedure Laterality Date   . Cesarean section, classic       once, for twins   . Pelvic laparoscopy       for infertility         Allergies:    Allergies   Allergen Reactions   . Adhesive Tape Rash       Current medications:    No current outpatient prescriptions on file.       Family History:    Family History   Problem Relation Age of Onset   . Alcohol abuse Paternal Grandfather    . Cancer Mother 76     colon polyps--possibly   . Depression Mother    . Heart disease Father 22     smoker   . Cancer Maternal Aunt      colon polyps   . Depression Maternal Uncle      suicide   . Cancer Maternal Grandmother 37     fatal colon cancer       Social/Occupational History:   History     Social History   . Marital Status: Married     Spouse Name: N/A     Number of Children: N/A   . Years of Education: N/A     Social History Main Topics   . Smoking status: Never Smoker    . Smokeless tobacco: Never Used   . Alcohol Use: Yes      Comment: occasional   . Drug Use: No   . Sexual Activity: Not on file     Other Topics Concern   . Not on file      Social History Narrative    Homemaker and active volunteer    Married with 3 children         Review of Systems:    Review of Systems   Constitutional: Negative.    HENT: Negative.  Negative for neck pain.    Eyes: Negative.    Respiratory: Negative.    Cardiovascular: Negative.    Gastrointestinal: Negative.    Genitourinary: Negative.    Musculoskeletal: Negative for myalgias, back pain, joint pain and falls.        Right foot pain   Skin: Negative.    Neurological: Negative.    Endo/Heme/Allergies: Negative.    Psychiatric/Behavioral: Negative.        Vital  Signs:   BP 96/56  Pulse 68  Ht 1.645 m (5' 4.75")  Wt 55.52 kg (122 lb 6.4 oz)  BMI 20.52 kg/m2  LMP 07/30/2013      PHYSICAL EXAM:  Physical Exam   Constitutional: She is oriented to person, place, and time. She appears well-developed and well-nourished. No distress.   HENT:   Head: Normocephalic and atraumatic.   Right Ear: External ear normal.   Left Ear: External ear normal.   Nose: Nose normal.   Mouth/Throat: Oropharynx is clear and moist. No oropharyngeal exudate.   Hearing intact to finger rubbing bilaterally   Eyes: Conjunctivae and EOM are normal. Pupils are equal, round, and reactive to light. No scleral icterus.   Neck: Normal range of motion. Neck supple. No JVD present. No tracheal deviation present. No thyromegaly present.   Cardiovascular: Normal rate, regular rhythm, normal heart sounds and intact distal pulses.  Exam reveals no gallop and no friction rub.    No murmur heard.  Pulmonary/Chest: Effort normal and breath sounds normal. No respiratory distress. She has no wheezes. She has no rales.   Abdominal: Soft. Bowel sounds are normal. She exhibits no distension and no mass. There is no tenderness. There is no rebound and no guarding.   Musculoskeletal: She exhibits no edema.   Lymphadenopathy:     She has no cervical adenopathy.   Neurological: She is alert and oriented to person, place, and time. No cranial nerve deficit. She  exhibits normal muscle tone. Coordination normal.   Babinski downgoing bilaterally  Vibration testing normal in the distal toes bilaterally.   Skin: Skin is warm and dry. No rash noted.   There is a well demarcated 3 x 4 cm soft pliable mobile nontender subcutaneous mass in the epigastrium.   Psychiatric: She has a normal mood and affect. Her behavior is normal. Judgment and thought content normal.           Assessment:    Nataleigh was seen today for annual exam.    Diagnoses and associated orders for this visit:    Screening  - Lipid panel; Future  - HIV 1&2 antigen/antibody; Future    Lipoma  - AMB REFERRAL TO PLASTIC SURGERY (Dr. Evangelisti/Dr. Marisa Sprinkles)    Right foot pain: Has been evaluated by orthopedics. MRI was negative for stress fracture or even significant tendinopathy. Recommended more supportive footwear.reduce mileage when bothering her.         .      Plan:        Immunizations discussed and updated.    Health care proxy reviewed.    Colon cancer screening discussed.    GYN care discussed.    Mammogram screening discussed.    Regular ophthalmology and dental care discussed.    HIV screening discussed.    Cardiovascular disease prevention discussed.    Osteoporosis screening and prevention discussed.

## 2013-08-21 ENCOUNTER — Ambulatory Visit: Payer: Self-pay

## 2013-11-25 ENCOUNTER — Encounter: Payer: Self-pay | Admitting: Gastroenterology

## 2014-02-03 ENCOUNTER — Telehealth: Payer: Self-pay | Admitting: Primary Care

## 2014-02-03 DIAGNOSIS — M79671 Pain in right foot: Secondary | ICD-10-CM

## 2014-02-03 DIAGNOSIS — M79672 Pain in left foot: Secondary | ICD-10-CM

## 2014-02-03 NOTE — Telephone Encounter (Signed)
I will put on your desk; pls fax

## 2014-02-03 NOTE — Telephone Encounter (Signed)
Pt is going to contact ortho for new script because they were who prescribed in the past. She will contact office tomorrow if she has any issues or with specifics for script.

## 2014-02-03 NOTE — Telephone Encounter (Signed)
Pt states she needs a script for an orthotic.

## 2014-02-03 NOTE — Telephone Encounter (Signed)
Has she had orthotics before?  What is the diagnosis that this is for?

## 2014-02-03 NOTE — Telephone Encounter (Signed)
Pt called back, pt contacted ortho, we wrote script in oct 2013. She would like script faxed to [facility] orthotics

## 2014-02-04 ENCOUNTER — Ambulatory Visit: Payer: Self-pay

## 2014-02-04 DIAGNOSIS — M79609 Pain in unspecified limb: Secondary | ICD-10-CM

## 2014-02-04 NOTE — Telephone Encounter (Signed)
Given to secretary to have faxed over to orthotics.

## 2014-02-04 NOTE — Patient Instructions (Signed)
[facility]      Your physician has determined that you require Prefabricated (off-the-shelf) Orthotic and Prosthetic (O&P) devices and/or Durable Medical Equipment (DME).  O&P devices include items such as braces for the spine or limbs, while DME includes items such as canes, crutches and walkers.  For your convenience you were fitted by the [place] [facility].    Return Policy:    Prefabricated O&P and DME items are not returnable if used outside of clinic due to hygiene concerns.    Special or custom orders are not returnable or refundable.    O&P devices and DME purchased from the  Orthotics and Prosthetics Department may only be exchanged if the item is faulty or poorly fitting, as determined by the O&P Department Clinical Coordinator.  Exchanges will be made using the same type of device, if within 7 days of the purchase date.    No exchange will be issued for items that were not used in accordance with the manufacturer’s recommended standard of care.    Replacement or repair of minor parts could become necessary due to wear.  This may include a charge and an order from you physician may be required.

## 2014-02-04 NOTE — Progress Notes (Signed)
Walk-in Visit    Patient name: Megan Sanders     Pain    02/04/14 1457   PainSc:   0 - No pain       Laterality: bilateral    Device:  Vasily Dananberg FO    Functional Goals: Provide joint stability    ROM settings: Not Applicable    Expected Frequency / Duration of Use:   Daily    Brand: Vasily                 Model: Dananberg FO's    Size: W 8    Modifications made: Not Applicable    Complexity:   Fitting was routine, requiring little to no adjustments    Yes Device was inspected for safety/security and found to be functioning properly    Ordered/Delivered: Device Delivered    The patient given verbal and written instructions.  The following Instructions were reviewed:   Purpose of device, Cleaning / Care of device, Potential Risks / Benefits, Frequency / Duration of use and How to report potential failure / malfunctions    Device Comfort Score: 9

## 2015-09-27 ENCOUNTER — Ambulatory Visit (INDEPENDENT_AMBULATORY_CARE_PROVIDER_SITE_OTHER): Payer: 59

## 2015-09-27 ENCOUNTER — Ambulatory Visit (INDEPENDENT_AMBULATORY_CARE_PROVIDER_SITE_OTHER): Payer: 59 | Admitting: Sports Medicine

## 2015-09-27 ENCOUNTER — Encounter: Payer: Self-pay | Admitting: Sports Medicine

## 2015-09-27 VITALS — BP 108/61 | HR 60 | Wt 121.0 lb

## 2015-09-27 DIAGNOSIS — M25551 Pain in right hip: Secondary | ICD-10-CM | POA: Diagnosis not present

## 2015-09-27 MED ORDER — MELOXICAM 15 MG PO TABS: ORAL_TABLET | ORAL | Status: DC

## 2015-09-27 NOTE — Assessment & Plan Note (Signed)
This likely represents a right iliac crest stress injury, she also has markedly weak right hip abductor's. Aggressive hip abductor rehabilitation, meloxicam, x-rays. Return to see me in one month, advanced imaging if no better.

## 2015-09-27 NOTE — Patient Instructions (Signed)
Hip Rehabilitation Protocol:  1.  Side leg raises.  3x30 with no weight, then 3x15 with 2 lb ankle weight, then 3x15 with 5 lb ankle weight 2.  Standing hip rotation.  3x30 with no weight, then 3x15 with 2 lb ankle weight, then 3x15 with 5 lb ankle weight. 3.  Side step ups.  3x30 with no weight, then 3x15 with 5 lbs in backpack, then 3x15 with 10 lbs in backpack. 

## 2015-09-27 NOTE — Progress Notes (Signed)
   Subjective:    I'm seeing this patient as a consultation for: hip pain    CC: "hip has been hurting"  HPI: Patient presents with a 3 week history of right hip pain. The pain is located along her iliac crest and does not radiate. The pain began a week or so after she finished a marathon in November. She was playing back-to-back 18 hole golf days and around hole 9 on the second day she began to have the pain. It is worse with twisting (such as when golfing) but the patient also notices it with running and walking. It is not associated with numbness or tingling. The patient endorses some weakness of hip movement. She has taken Advil OTC and this provided moderate relief. Patient denies changes to bowel or bladder function.  Past medical history, Surgical history, Family history not pertinant except as noted below, Social history, Allergies, and medications have been entered into the medical record, reviewed, and no changes needed.   Review of Systems: No headache, visual changes, nausea, vomiting, diarrhea, constipation, dizziness, abdominal pain, skin rash, fevers, chills, night sweats, weight loss, swollen lymph nodes, body aches, joint swelling, muscle aches, chest pain, shortness of breath, mood changes, visual or auditory hallucinations.   Objective:   General: Well Developed, well nourished, and in no acute distress.  Neuro/Psych: Alert and oriented x3, extra-ocular muscles intact, able to move all 4 extremities, sensation grossly intact. Skin: Warm and dry, no rashes noted.  Respiratory: Not using accessory muscles, speaking in full sentences, trachea midline.  Cardiovascular: Pulses palpable, no extremity edema. Abdomen: Does not appear distended. Back Exam:  Inspection: Unremarkable  Motion: Flexion 45 deg, Extension 45 deg, Side Bending to 45 deg bilaterally,  Rotation to 45 deg bilaterally  SLR laying: Negative  XSLR laying: Negative  Palpable tenderness: Right iliac  crest. Sensory change: Gross sensation intact to all lumbar and sacral dermatomes.  Reflexes: 2+ at both patellar tendons, 2+ at achilles tendons, Babinski's downgoing.  Strength at foot  Plantar-flexion: 5/5 Dorsi-flexion: 5/5 Eversion: 5/5 Inversion: 5/5  Leg strength  Quad: 5/5 Hamstring: 5/5 Hip flexor: 5/5 Hip abductors: 5/5  Gait unremarkable. Right Hip: ROM IR: 45 Deg, ER: 45 Deg, Flexion: 120 Deg, Extension: 100 Deg, Abduction: 45 Deg, Adduction: 45 Deg Strength IR: 5/5, ER: 5/5, Flexion: 5/5, Extension: 5/5, Abduction: 4/5, Adduction: 5/5 Resisted abduction does not reproduce pain. Pelvic alignment unremarkable to inspection. Pelvis tender to palpation over the right iliac crest. Standing hip rotation and gait without trendelenburg sign / unsteadiness. Greater trochanter without tenderness to palpation. No tenderness over piriformis. No pain with FABER or FADIR. No SI joint tenderness and normal minimal SI movement.   Impression and Recommendations:   This case required medical decision making of moderate complexity.  Patient most likely an iliac crest stress fracture due to location of the pain. Gluteus strain was considered but is less likely given the lack of reproducible symptoms with resisted motion. Will Rx Meloxicam for pain and will get a pelvis X-ray to evaluate for a fracture. Patient instructed to do home hip exercises to strengthen the weakened abductors. Patient also instructed to avoid running but encouraged to cross train (swimming, cycling, and resistance training). F/U in 1 month to evaluate progress.

## 2015-10-04 ENCOUNTER — Ambulatory Visit (INDEPENDENT_AMBULATORY_CARE_PROVIDER_SITE_OTHER): Payer: 59 | Admitting: Sports Medicine

## 2015-10-04 ENCOUNTER — Encounter: Payer: Self-pay | Admitting: Sports Medicine

## 2015-10-04 VITALS — BP 110/72 | Ht 66.0 in | Wt 120.0 lb

## 2015-10-04 DIAGNOSIS — M25551 Pain in right hip: Secondary | ICD-10-CM | POA: Diagnosis not present

## 2015-10-04 NOTE — Progress Notes (Signed)
   Subjective:    Patient ID: Alexandria Huffman, female    DOB: 12/15/1965, 49 y.o.   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 Pain started while training for a marathon but was made worse when golfing a few days later. She was initially getting some spasm and tightness in the lateral aspect of her hip which became more acute pain while playing golf. Since then she's had a persistent discomfort which has caused her to stop running. She has been able to swim and bike without any problem. She was seen by Dr Benjamin Stainhekkekandam in [place] and diagnosed with a possible iliac crest stress fracture and advised not to run. X-rays done during that visit showed nothing acute. She was placed on some hip abductor exercises and placed on meloxicam and as a result she has noticed her symptoms starting to improve. She denies any deep-seated groin pain but over the past week or so has developed an occasional "twinge" in the groin. She denies similar problems in the past. Mild pain at rest. No numbness or tingling.  Past medical history reviewed Medications reviewed Allergies reviewed    Review of Systems    as above Objective:   Physical Exam Well-developed, fit appearing. No acute distress. Awake alert and oriented 3. Vital signs reviewed.  Right hip: Smooth painless hip range of motion with a negative log roll. There is tenderness to palpation along the course of the gluteus medius muscle belly and tendon insertion onto the superior aspect of the greater trochanter. Mild tenderness along the iliac crest as well. No tenderness to palpation along the obliques. She has 4+/5 strength with resisted hip abduction. No real pain with this. Negative FABER and FADIR. Neurovascularly intact distally. Walking without a limp.  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 If a tear is confirmed then we will consider starting a nitroglycerin protocol. She will continue with her home exercises. I think they are already helping her. I think she can continue with exercise using pain as her guide including some light running.

## 2015-10-07 ENCOUNTER — Ambulatory Visit (INDEPENDENT_AMBULATORY_CARE_PROVIDER_SITE_OTHER): Payer: 59 | Admitting: Sports Medicine

## 2015-10-07 ENCOUNTER — Encounter: Payer: Self-pay | Admitting: Sports Medicine

## 2015-10-07 VITALS — BP 108/60 | Ht 65.5 in | Wt 120.0 lb

## 2015-10-07 DIAGNOSIS — M7631 Iliotibial band syndrome, right leg: Secondary | ICD-10-CM | POA: Diagnosis not present

## 2015-10-07 MED ORDER — NITROGLYCERIN 0.2 MG/HR TD PT24: MEDICATED_PATCH | TRANSDERMAL | Status: DC

## 2015-10-07 NOTE — Progress Notes (Signed)
   Subjective:    Patient ID: Alexandria Huffman, female    DOB: 01/06/1966, 49 y.o.   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 Pain started while training for a marathon but was made worse when golfing a few days later. She was initially getting some spasm and tightness in the lateral aspect of her hip which became more acute pain while playing golf. Since then she's had a persistent discomfort which has caused her to stop running. She has been able to swim and bike without any problem. She was seen by Dr Benjamin Stainhekkekandam in [place] and diagnosed with a possible iliac crest stress fracture and advised not to run. X-rays done during that visit showed nothing acute. She was placed on some hip abductor exercises and placed on meloxicam and as a result she has noticed her symptoms starting to improve. She denies any deep-seated groin pain but over the past week or so has developed an occasional "twinge" in the groin. She denies similar problems in the past. Mild pain at rest. No numbness or tingling.  10/07/15 visit - Pt returns today with minimal improvement in her Sx.  Pain is worse with downhill running but localized in the lateral aspect of her R hip.  Denies paresthesias or recent changes in activity.    Past medical history reviewed Medications reviewed Allergies reviewed    Review of Systems    as above Objective:   Physical Exam Well-developed, fit appearing. No acute distress. Awake alert and oriented 3. Vital signs reviewed.  Right hip: Smooth painless hip range of motion with a negative log roll. There is tenderness to palpation along the course of the gluteus medius muscle belly and tendon insertion onto the superior aspect of the greater trochanter. Mild tenderness along ITB and at GT lateral facet. No tenderness to palpation along the obliques. She  has 4+/5 strength with resisted hip abduction. No real pain with this. Negative FABER and FADIR. Neurovascularly intact distally. Walking without a limp.  US - Gluteus medius/minimus muscle belly and tendon appear normal with insertion as well.  TFL nml.  ITB with hyperechoic changes over the GT c/w previous injury.  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

## 2015-10-07 NOTE — Patient Instructions (Signed)

## 2015-10-26 ENCOUNTER — Ambulatory Visit: Payer: 59 | Admitting: Sports Medicine

## 2015-11-18 ENCOUNTER — Ambulatory Visit (INDEPENDENT_AMBULATORY_CARE_PROVIDER_SITE_OTHER): Payer: 59 | Admitting: Sports Medicine

## 2015-11-18 ENCOUNTER — Encounter: Payer: Self-pay | Admitting: Sports Medicine

## 2015-11-18 VITALS — BP 113/61 | Ht 66.0 in | Wt 120.0 lb

## 2015-11-18 DIAGNOSIS — M25551 Pain in right hip: Secondary | ICD-10-CM

## 2015-11-18 DIAGNOSIS — M7631 Iliotibial band syndrome, right leg: Secondary | ICD-10-CM

## 2015-11-18 NOTE — Progress Notes (Signed)
   Subjective:    Patient ID: Alexandria Huffman, female    DOB: 01/02/1966, 50 y.o.   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 Pain started while training for a marathon but was made worse when golfing a few days later. She was initially getting some spasm and tightness in the lateral aspect of her hip which became more acute pain while playing golf. Since then she's had a persistent discomfort which has caused her to stop running. She has been able to swim and bike without any problem. She was seen by Dr Benjamin Stain in [place] and diagnosed with a possible iliac crest stress fracture and advised not to run. X-rays done during that visit showed nothing acute. She was placed on some hip abductor exercises and placed on meloxicam and as a result she has noticed her symptoms starting to improve. She denies any deep-seated groin pain but over the past week or so has developed an occasional "twinge" in the groin. She denies similar problems in the past. Mild pain at rest. No numbness or tingling.  10/07/15 visit - Pt returns today with minimal improvement in her Sx.  Pain is worse with downhill running but localized in the lateral aspect of her R hip.  Denies paresthesias or recent changes in activity.    11/18/15 - Pt doing much better today, about 75% improved.  Denies paresthesias but still having occasional pain.  Has not done exercises as much as she thinks she has been.  No pain with running at this time.   Past medical history reviewed Medications reviewed Allergies reviewed    Review of Systems    as above Objective:   Physical Exam Well-developed, fit appearing. No acute distress. Awake alert and oriented 3. Vital signs reviewed.  Right hip: Smooth painless hip range of motion with a negative log roll. No TTP along the course of the gluteus medius  muscle belly and tendon insertion onto the superior aspect of the greater trochanter. Mild tenderness along ITB and at GT lateral facet. No tenderness to palpation along the obliques. She has 5/5 strength with resisted hip abduction/F of the R hip. No real pain with this. Negative FABER and FADIR. Neurovascularly intact distally. Walking without a limp.     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  Will refer to PT to try to expedite her back to 100% - Continue Abduction exercises and avoid downhill running/overstriding - Restart nitro  - F/U in 4-6 weeks to reevaluate - Pain as guidance for running

## 2015-12-07 ENCOUNTER — Encounter: Payer: Self-pay | Admitting: Sports Medicine

## 2015-12-07 ENCOUNTER — Ambulatory Visit (INDEPENDENT_AMBULATORY_CARE_PROVIDER_SITE_OTHER): Payer: 59 | Admitting: Sports Medicine

## 2015-12-07 VITALS — BP 115/71 | Ht 65.5 in | Wt 125.0 lb

## 2015-12-07 DIAGNOSIS — M25551 Pain in right hip: Secondary | ICD-10-CM

## 2015-12-07 NOTE — Progress Notes (Signed)
   Subjective:    Patient ID: Alexandria Huffman, female    DOB: June 06, 1966, 50 y.o.   MRN: [id number]  HPI   Patient comes in today with persistent right hip pain. Pain has become more diffuse. Actually more concentrated in the proximal hamstring area now. Does radiate into her groin. It will radiate at times down the leg as well. No associated numbness or tingling. She has been working with physical therapy. Prior x-rays were unremarkable.    Review of Systems     Objective:   Physical Exam Well-developed, well-nourished.  Right hip: Smooth painless hip range of motion with a negative log roll. She does have some pain with FADIR testing. Negative hop test. No tenderness to palpation. Neurovascularly intact distally.       Assessment & Plan:  Persistent right hip pain-rule out labral tear versus soft tissue pathology  Patient continues to have pain despite physical therapy and conservative treatment to date. Physical exam is concerning for possible labral pathology. X-rays were unremarkable. We will pursue an MRI arthrogram of the right hip specifically to rule out a labral tear and to evaluate for soft tissue pathology such as proximal hamstring tendinopathy or gluteus medius tendinopathy. Patient will follow-up with me in the office 2-3 days after her MRI to discuss those results and delineate a more definitive treatment plan.

## 2015-12-13 NOTE — Patient Instructions (Signed)
MRI R Hip with Contrast PA # is [id number]

## 2015-12-22 ENCOUNTER — Ambulatory Visit
Admission: RE | Admit: 2015-12-22 | Discharge: 2015-12-22 | Disposition: A | Discharge status: Home / Self Care | Payer: 59 | Source: Ambulatory Visit | Attending: Sports Medicine | Admitting: Sports Medicine

## 2015-12-22 DIAGNOSIS — M25551 Pain in right hip: Secondary | ICD-10-CM

## 2015-12-22 MED ORDER — IOHEXOL 180 MG/ML  SOLN
12.0000 mL | Freq: Once | INTRAMUSCULAR | Status: AC | PRN
  Administered 2015-12-22: 12 mL via INTRA_ARTICULAR

## 2015-12-27 ENCOUNTER — Other Ambulatory Visit: Payer: Self-pay | Admitting: Sports Medicine

## 2015-12-27 ENCOUNTER — Encounter: Payer: Self-pay | Admitting: Sports Medicine

## 2015-12-27 ENCOUNTER — Ambulatory Visit (INDEPENDENT_AMBULATORY_CARE_PROVIDER_SITE_OTHER): Payer: 59 | Admitting: Sports Medicine

## 2015-12-27 VITALS — BP 120/69 | HR 54 | Ht 66.0 in | Wt 125.0 lb

## 2015-12-27 DIAGNOSIS — M24159 Other articular cartilage disorders, unspecified hip: Secondary | ICD-10-CM

## 2015-12-27 DIAGNOSIS — M169 Osteoarthritis of hip, unspecified: Secondary | ICD-10-CM | POA: Diagnosis not present

## 2015-12-27 DIAGNOSIS — M25551 Pain in right hip: Secondary | ICD-10-CM

## 2015-12-27 NOTE — Progress Notes (Signed)
Patient ID: Alexandria Huffman, female   DOB: 04/10/1966, 50 y.o.   MRN: [id number]   Patient comes in today to discuss MRI arthrogram findings of her right hip. 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 Only mild degenerative changes. Incidental finding of osteitis pubis. Based on these findings I recommended consultation with a hip arthroscopist. She would like to see Dr. Caswell [place]Stubbs in [place]. I will arrange for an intra-articular cortisone injection to be done at [facility] for diagnostic as well as therapeutic reasons prior to her appointment with Dr. Caswell CorwinStubbs. I'll defer further workup and treatment to the discretion of Dr. Caswell CorwinStubbs and she will follow-up with me as needed.  A total of 10 minutes was spent with the patient discussing her MRI findings as well as further treatment options.

## 2015-12-28 ENCOUNTER — Ambulatory Visit: Payer: 59 | Admitting: Sports Medicine

## 2015-12-31 ENCOUNTER — Other Ambulatory Visit: Payer: 59

## 2016-05-11 ENCOUNTER — Encounter: Payer: Self-pay | Admitting: Sports Medicine

## 2016-05-11 ENCOUNTER — Ambulatory Visit (INDEPENDENT_AMBULATORY_CARE_PROVIDER_SITE_OTHER): Payer: 59 | Admitting: Sports Medicine

## 2016-05-11 VITALS — BP 105/48 | Ht 65.5 in | Wt 125.0 lb

## 2016-05-11 DIAGNOSIS — M25551 Pain in right hip: Secondary | ICD-10-CM

## 2016-05-11 DIAGNOSIS — M169 Osteoarthritis of hip, unspecified: Secondary | ICD-10-CM | POA: Diagnosis not present

## 2016-05-11 DIAGNOSIS — M24159 Other articular cartilage disorders, unspecified hip: Secondary | ICD-10-CM | POA: Insufficient documentation

## 2016-05-11 NOTE — Assessment & Plan Note (Addendum)
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 This is non-surgical per Dr. Caswell CorwinStubbs  - Stop running for now - Continue PT and strengthening exercises  - Follow up in 3 months

## 2016-05-11 NOTE — Assessment & Plan Note (Signed)
On US this looks to be mostly intact  I think she needs to give this a chance to heal with stopping running for minimum of 6 weeks and more likely 12 Xtrain on bike and swimming Recheck and consider plan for restarting training She has qualified and [place] [facility] [place] Has run [place] this year and done half ironman

## 2016-05-11 NOTE — Progress Notes (Signed)
   [facility] Noralee CharsAsiyah Dewaine Morocho, MD Phone: [phone]  Reason For Visit: Follow up right hip  # Hip pain going back to November after [place] [facility] [place] and then on golfing trip where she had  Acute pain. [facility] [place], consideration of illiac crest fracture which was negative. Then seen by Dr. 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 She was referred to Dr. Caswell CorwinStubbs who told her she was not a surgical candidate, and was not sure the labral tear was the cause of her pain. Patient complains of continued anterior hip groin pain. Patient has not received any steroid injections in the hip.   ROS: No swelling, redness, numbness or tingling   Past Medical History - No surgical hx of right hip  Reviewed problem list.  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 Normal range of motion with flexion, extension, internal, external and abduction. 5 out of 5 strength in hip.  Negative FABER   FADIR testing causes mild pain Neurovascularly intact.   Left Hip: no abnormalities on inspection, normal range of motion, and 5 out of 5 strength, neurovascularly intact  Right Hamstring with normal strength on knee flexion medial, and then with slight internal/external rotation   Ultrasound of right hip: No hypoechoic change/fluid collection to suggest joint effusion.  No abnormal swelling involving the left hip adductors or iliopsoas.. Labrum visualized and mostly itact possbile small tear present with improvement from last MRI Note calcification at HS insertion ischial tub but tendons intact  MRI  reviewed from Feb.  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 This is non-surgical per Dr. Caswell CorwinStubbs  - Stop running for now - Continue PT and strengthening exercises  - Follow up in 3 months

## 2016-05-11 NOTE — Patient Instructions (Addendum)
Follow up in six weeks. Rest from running, other options discussed today. Continue PT

## 2016-06-22 ENCOUNTER — Encounter: Payer: Self-pay | Admitting: Sports Medicine

## 2016-06-22 ENCOUNTER — Ambulatory Visit (INDEPENDENT_AMBULATORY_CARE_PROVIDER_SITE_OTHER): Payer: 59 | Admitting: Sports Medicine

## 2016-06-22 DIAGNOSIS — M169 Osteoarthritis of hip, unspecified: Secondary | ICD-10-CM | POA: Diagnosis not present

## 2016-06-22 DIAGNOSIS — M24159 Other articular cartilage disorders, unspecified hip: Secondary | ICD-10-CM

## 2016-06-22 NOTE — Progress Notes (Signed)
  Almira BarLori Gayler - 50 y.o. female MRN [id number]  Date of birth: 12/25/1965  SUBJECTIVE:  Including CC & ROS.  CC: right hip pain Patient presents for follow-up of right anterior superior labral tear diagnosed on MRI and seen on ultrasound at her last visit. She is an avid runner and ran in the near extremity marathon. She injured it playing golf. She has not been running for the past 6 weeks and is noticed marked improvement. She has been cross training with biking and swimming. She had been doing exercises but stopped doing them recently due to traveling. She does remember all of them. She wants to train to be able to [place] [facility] [place] April. Denies any numbness or tingling distally.   ROS: No unexpected weight loss, fever, chills, swelling, instability, muscle pain, numbness/tingling, redness, otherwise see HPI   PMHx - Updated and reviewed.  Contributory factors include: Negative PSHx - Updated and reviewed.  Contributory factors include:  Negative FHx - Updated and reviewed.  Contributory factors include:  Negative Social Hx - Updated and reviewed. Contributory factors include: Negative Medications - reviewed   DATA REVIEWED: Previous office visits and MRI   PHYSICAL EXAM:  VS: BP:(!) 101/59  HR:(!) 57bpm  TEMP: ( )  RESP:   HT:5\' 6"  (167.6 cm)   WT:125 lb (56.7 kg)  BMI:20.2 PHYSICAL EXAM: Gen: NAD, alert, cooperative with exam, well-appearing HEENT: clear conjunctiva,  CV:  no edema, capillary refill brisk, normal rate Resp: non-labored Skin: no rashes, normal turgor  Neuro: no gross deficits.  Psych:  alert and oriented  Hip: ROM IR: 45 Deg, ER: 45 Deg, Flexion: 120 Deg, Extension: 100 Deg, Abduction: 45 Deg, Adduction: 45 Deg Strength IR: 5/5, ER: 5/5, Flexion: 5/5, Extension: 5/5, Abduction: 5/5, Adduction: 5/5 Pelvic alignment unremarkable to inspection and palpation. Standing hip rotation and gait without trendelenburg sign / unsteadiness. Greater  trochanter without tenderness to palpation. No tenderness over piriformis. No pain with FABER or FADIR.  Observed running form. While observing, her shoulders, hips were at the same level while she ran. She has a healed striker. No intoeing or out toeing. No pronation or supination of her feet.  Limited ultrasound of the right hip performed. Labrum seen with small tear. No effusion seen. No degenerative changes in the articular cartilage.   ASSESSMENT & PLAN:   Labral tear of hip, degenerative With her cross training and exercise program, she has done well. With her strength and pain-free running, we will let her back into running. She was instructed to do this gradually over the next 4-6 weeks. She will follow-up as needed.

## 2016-06-22 NOTE — Assessment & Plan Note (Signed)
With her cross training and exercise program, she has done well. With her strength and pain-free running, we will let her back into running. She was instructed to do this gradually over the next 4-6 weeks. She will follow-up as needed.

## 2016-06-30 ENCOUNTER — Emergency Department (INDEPENDENT_AMBULATORY_CARE_PROVIDER_SITE_OTHER)
Admission: EM | Admit: 2016-06-30 | Discharge: 2016-06-30 | Disposition: A | Discharge status: Other Acute Inpatient Hospital | Payer: 59 | Source: Home / Self Care | Attending: Family Medicine | Admitting: Family Medicine

## 2016-06-30 DIAGNOSIS — R079 Chest pain, unspecified: Secondary | ICD-10-CM | POA: Diagnosis not present

## 2016-06-30 DIAGNOSIS — R001 Bradycardia, unspecified: Secondary | ICD-10-CM

## 2016-06-30 MED ORDER — ASPIRIN 325 MG PO TABS
325.0000 mg | ORAL_TABLET | Freq: Once | ORAL | Status: AC
  Administered 2016-06-30: 325 mg via ORAL

## 2016-06-30 NOTE — ED Provider Notes (Signed)
  Pt c/o persisent chest pain that started in the center of her chest 4 days ago,  Pain is now radiating into her Left arm.  Pain was worse when it initially started. No prior hx of CAD but notes her father had an MI in his 8450s and her brother has had a blood clot in the past, she is unsure of what caused it.  She does not have hx of blood clots. Denies leg pain or swelling. Denies SOB, nausea or diaphoresis. She has not f/u with a PCP for about 2 years as she is new to the area and never established care.   <ECG>  Date/Time:06/30/16   18:31:42 Ventricular Rate: 54 PR Interval: 176 QRS Duration: 84 QT Interval: 434 QTC Calculation:424 P-QRS-T: 27/25/33 Text Interpretation: Sinus bradycardia, RSR (V1)- nondiagnostic, Negative precordial T-waves  -probably normal -consider anteroseptal ischemia.  Probably normal for age.  No prior EKG to compare.  Pt is alert, oriented, NAD. cooperative during exam.   Although no definite MI on EKG, due to pt's family hx, recommend pt be evaluated in emergency department so cardiac enzymes and potentially a D-dimer can be tested.    325mg  aspirin given to pt prior to discharge. Pt safe for discharge to closest hospital POV.     Junius Finnerrin O'Malley, PA-C 06/30/16 1941

## 2016-06-30 NOTE — ED Triage Notes (Signed)
Pt stated that she started with chest pain on Sunday.  It has progressively become worse.  This evening it started going down left arm.

## 2016-07-01 ENCOUNTER — Telehealth: Payer: Self-pay | Admitting: Emergency Medicine

## 2016-08-08 ENCOUNTER — Telehealth: Payer: Self-pay | Admitting: *Deleted

## 2016-08-08 ENCOUNTER — Other Ambulatory Visit: Payer: Self-pay | Admitting: Sports Medicine

## 2016-08-08 DIAGNOSIS — M25551 Pain in right hip: Secondary | ICD-10-CM

## 2016-08-08 DIAGNOSIS — M24159 Other articular cartilage disorders, unspecified hip: Secondary | ICD-10-CM

## 2016-08-08 DIAGNOSIS — M169 Osteoarthritis of hip, unspecified: Secondary | ICD-10-CM

## 2016-08-08 NOTE — Telephone Encounter (Signed)
Order placed and [facility] will call to schedule the patient

## 2016-08-09 ENCOUNTER — Ambulatory Visit
Admission: RE | Admit: 2016-08-09 | Discharge: 2016-08-09 | Disposition: A | Discharge status: Home / Self Care | Payer: 59 | Source: Ambulatory Visit | Attending: Sports Medicine | Admitting: Sports Medicine

## 2016-08-09 DIAGNOSIS — M25551 Pain in right hip: Secondary | ICD-10-CM

## 2016-08-09 MED ORDER — METHYLPREDNISOLONE ACETATE 40 MG/ML INJ SUSP (RADIOLOG
120.0000 mg | Freq: Once | INTRAMUSCULAR | Status: AC
  Administered 2016-08-09: 120 mg via INTRA_ARTICULAR

## 2016-08-09 MED ORDER — IOPAMIDOL (ISOVUE-M 200) INJECTION 41%
1.0000 mL | Freq: Once | INTRAMUSCULAR | Status: AC
  Administered 2016-08-09: 1 mL via INTRA_ARTICULAR

## 2017-03-21 IMAGING — CR DG HIP (WITH OR WITHOUT PELVIS) 2-3V*R*
3 series · 3 of 3 positions shown · non-contrast
Comparison: None.

CLINICAL DATA: Pain over the right iliac crest for 2 months.
Marathon runner.

EXAM:
DG HIP (WITH OR WITHOUT PELVIS) 2-3V RIGHT

[pelvis ap]
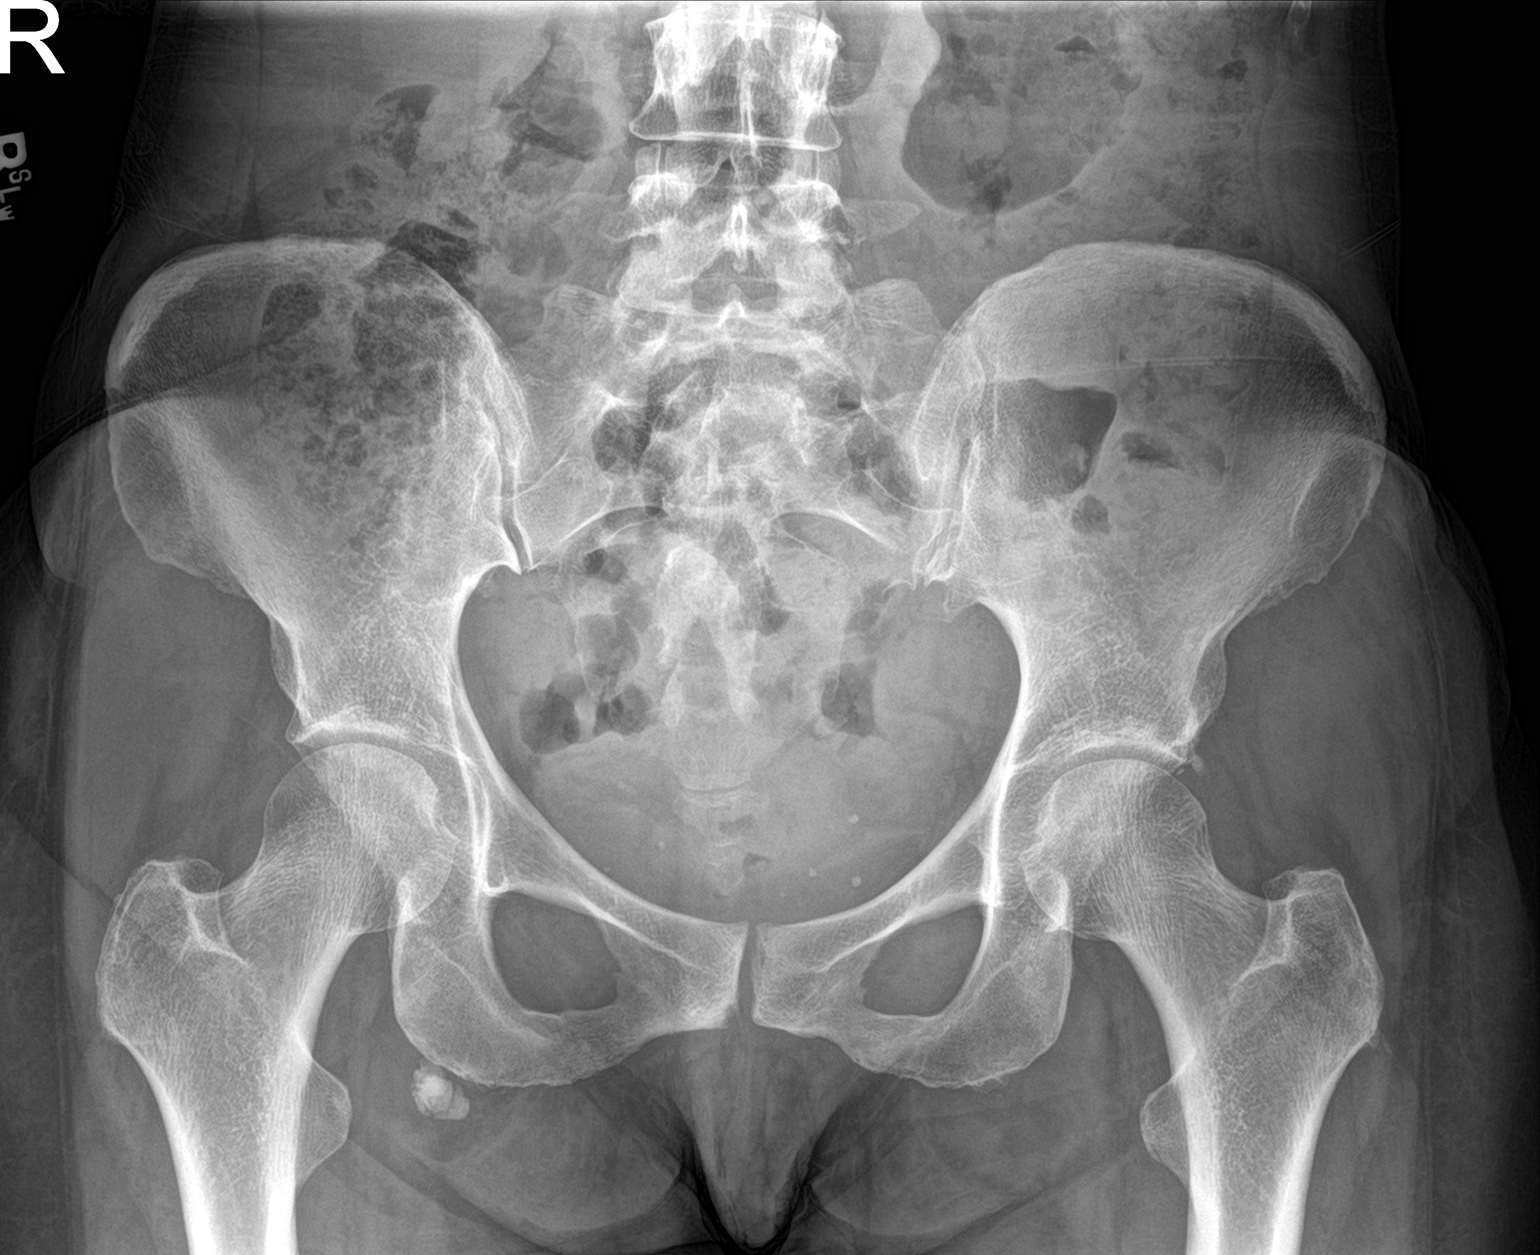

[hip ap]
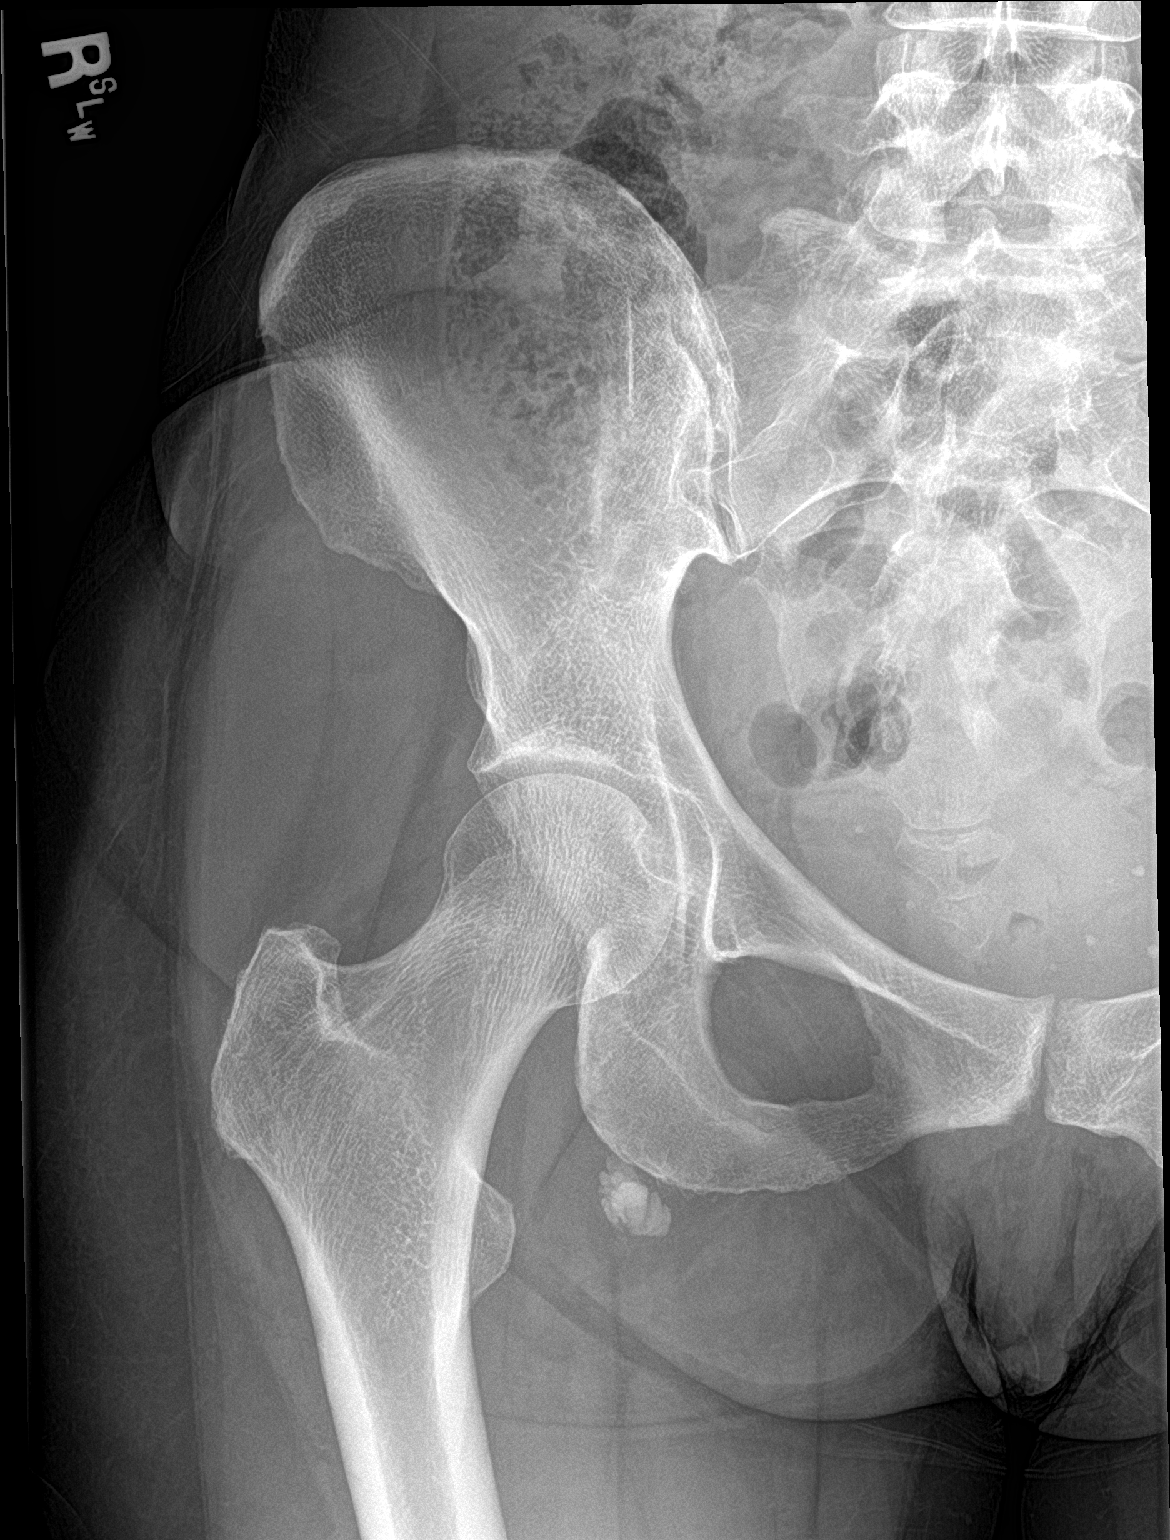

[hip lat]
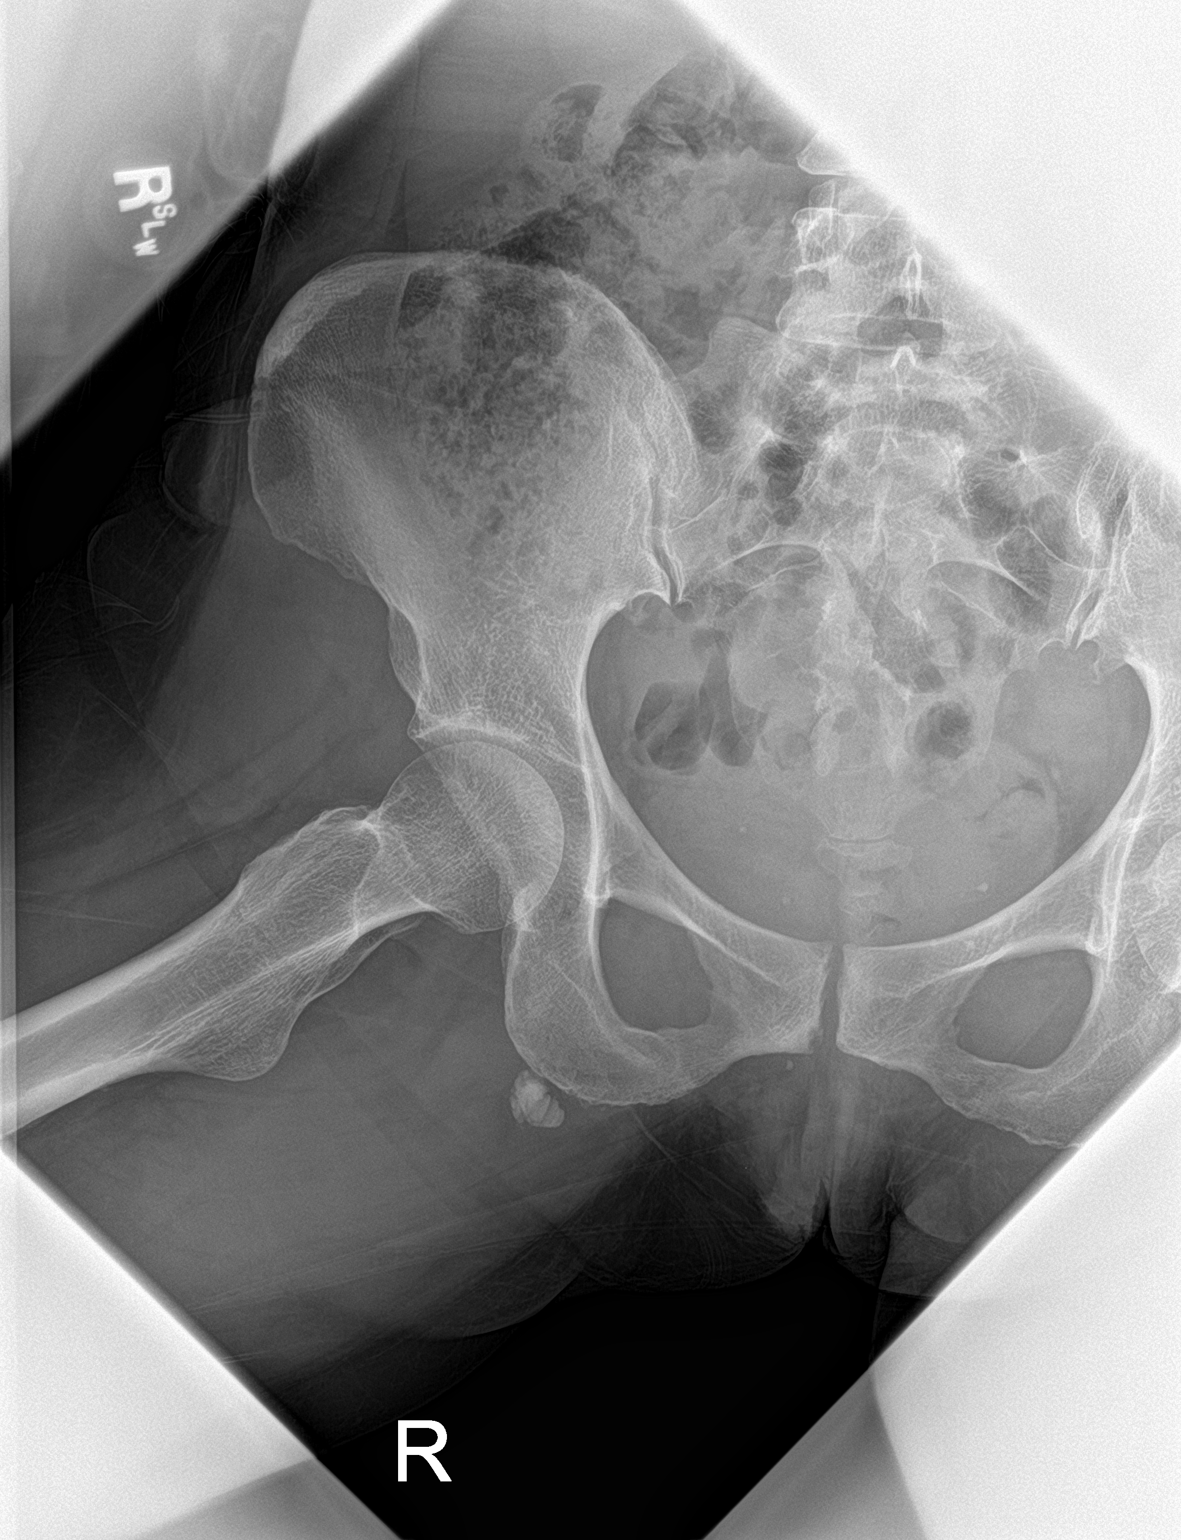

[3 of 3 positions shown; findings below may reference images not displayed]

FINDINGS: Soft tissue calcification adjacent to the right inferior pubic
ramus, likely related to old soft tissue injury. No acute bony
abnormality. No fracture, subluxation or dislocation. SI joints and
hip joints are symmetric and unremarkable.
IMPRESSION: No acute bony abnormality.

## 2017-08-23 ENCOUNTER — Ambulatory Visit (INDEPENDENT_AMBULATORY_CARE_PROVIDER_SITE_OTHER): Payer: 59 | Admitting: Sports Medicine

## 2017-08-23 ENCOUNTER — Ambulatory Visit: Payer: Self-pay

## 2017-08-23 VITALS — BP 96/62 | Ht 65.5 in | Wt 120.0 lb

## 2017-08-23 DIAGNOSIS — M84374A Stress fracture, right foot, initial encounter for fracture: Secondary | ICD-10-CM

## 2017-08-23 DIAGNOSIS — M79671 Pain in right foot: Secondary | ICD-10-CM

## 2017-08-23 NOTE — Assessment & Plan Note (Signed)
This is healing Modify acitivity Arch strap Forefoot padding on sports insoles  Note her runnign gait is excellent and no limp  Reck if not healed in 4 wks

## 2017-08-23 NOTE — Patient Instructions (Signed)
You were seen in clinic today for a stress fracture of your right foot. Please take a break from outdoor running for the next three weeks. You may do light training on the treadmill or running in the pool. -Ice your foot for 5-10 minutes after activity. Elevate your foot during this time. -Use the strap for your forefoot to help with support.  -We will call you with your lab results (we ordered a CBC and ferritin study today). -Please start supplementing with calcium 600mg  and vitamin D 400 IU, taking two pills daily  -In three weeks, practice running in place or jumping in place. If you are still having pain, take more time to rest/heal. Consider calling the clinic at that time.

## 2017-08-23 NOTE — Progress Notes (Signed)
is a 51 y.o. female with a history of right labral tear (received steroid injections and PT), right foot plantar fascciitis who presents for foot pain. She is a runner and plays tennis and golf. Her last visit was in August 2017.  S: Patient reports about 1 month of right foot pain. This has happened in the setting of 1) increased running distance (had reached up to 11 miles by increasing distance in 1 mile increments), 2) increased tennis playing, and 3) change in shoes and 4) injury to the right foot after hitting it accidentally against her standing bike. Localized to the 4th metatarsal, 4-7/10 in severity. She will occasionally take advil but tries to avoid it to not mask the symptoms. She is concerned that it may be a stress fracture. Continued running outdoors until about a week ago, when she starting to scale back on exercise and rest. Has become a little better with post-workout icing. Pain is different from plantar fasciitis pain. Her hip pain is improved after getting CS injections. Patient is hoping to participate in a half iron man in December and a full one in July. She denies weakness, numbness, tingling, bleeding, skin infection.  Ros No headaches or difficulty breathing. Has irregular periods still. Not taking supplements. Notes a few pound weight loss recently, unintentional.  O: Vitals:   08/23/17 1424  BP: 96/62   Gen: in no apparent distress MSK: -loss of long arch with standing bilat -No transverse arch drop on the right foot -no apparent swelling in the right midfoot -Hindfoot straight bilaterally  -Left foot with prominent navicular and great toe pronation, slight midfoot drop -point tenderness to the distal portion of the right 4th metatarsal  -strength 5/5 on right foot inversion, eversion, dorsiflexion, and plantar flexion. No limited mobility in right midfoot.  -Right great toe with 45 degrees extension and 30 degrees flexion -Running: forefoot strike, otherwise good  running form Neuro: light touch sensation intact and equal in bilateral toes  Vasculature: cap refill <2s in great toes    Ultrasound, of metatarsal of Right Foot: Right foot:  Hypoechoic capsule with calcifications between the 3rd and 4th metatarsal cortical disruption over the superomedial aspect of the 4th metatarsal.  Increased vascularity on superficial border of capsule.  Most consistent with healing stress fracture. Also noted to have cortical irregularity just distal to point of maximal tenderness most consistent with old, healed stress fracture.    Incidental Left Foot: only one sesmoid bone at MTP 1  Impression:  This looks like a subacute 4th MT stress fracture with some persistent swelling  Ultrasound and interpretation by Sibyl ParrKarl B. Clista Rainford, MD   A/P: 51 y.o. female with a stress fracture of the right 4th metatarsal. Appears to be approximately 80% healed today. Patient to rest for 3 weeks with limited activity, avoiding running outdoors on hard surfaces. OTC analgesics as needed, frequent icing. Return in 3 weeks if pain has not resolved.   -No running outdoors for 3 weeks  -may do light training on the treadmill or running in the pool during this time -Ice right foot for 5-10 minutes after activity. Elevate right foot during this time. -forefoot strap given  -CBC and ferritin today to assess nutritional status -Start supplementing with calcium 600mg  and vitamin D 400 IU, taking two pills daily  -In three weeks, practice running in place or jumping in place. If you are still having pain, take more time to rest/heal. -RTC 3 weeks if not improved

## 2017-08-24 LAB — CBC
Hematocrit: 40.9 % (ref 34.0–46.6)
Hemoglobin: 13.8 g/dL (ref 11.1–15.9)
MCH: 30.9 pg (ref 26.6–33.0)
MCHC: 33.7 g/dL (ref 31.5–35.7)
MCV: 92 fL (ref 79–97)
PLATELETS: 277 10*3/uL (ref 150–379)
RBC: 4.46 x10E6/uL (ref 3.77–5.28)
RDW: 13.4 % (ref 12.3–15.4)
WBC: 6.3 10*3/uL (ref 3.4–10.8)

## 2017-08-24 LAB — FERRITIN: Ferritin: 58 ng/mL (ref 15–150)

## 2017-09-24 ENCOUNTER — Ambulatory Visit (INDEPENDENT_AMBULATORY_CARE_PROVIDER_SITE_OTHER): Payer: 59 | Admitting: Sports Medicine

## 2017-09-24 VITALS — BP 94/60 | Ht 65.5 in | Wt 117.0 lb

## 2017-09-24 DIAGNOSIS — M84374D Stress fracture, right foot, subsequent encounter for fracture with routine healing: Secondary | ICD-10-CM

## 2017-09-24 NOTE — Progress Notes (Signed)
Subjective:    Patient ID: Alexandria Huffman, female    DOB: 06/17/1966, 51 y.o.   MRN: [id number]  Alexandria Huffman returns to clinic for follow up. 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 She was seen by Dr. Darrick PennaFields one month ago and diagnosed with a right foot 4th MT stress fracture. At that visit she was instructed to discontinue strenuous activity for an additional 3 weeks and to RTC if the pain was still persisting. Over the thanksgiving holiday she played ultimate Frisbee with her children on grass and noticed some discomfort in her right foot again. This past weekend she went dancing at a holiday party and wore boots and again the pain returned. So she came to clinic today to see if she still needs to take more time off from activity and perhaps get another ultrasound or plain films to see if anything else is going on. At her last visit with Dr. Darrick PennaFields she was given a band that went around her forefoot but she felt like it squeezed to tightly. She also tried wearing a post-op shoe and then that was causing some changes in her gait and irritating her hamstring. Overall, she feels as though it was a "perfect storm" with her injury and changing running shoes and increasing her mileage with her distance running training. She has opted not to do the Iron Man later this month because the course is a concrete running path in a golf course.      Review of Systems  Constitutional: Negative.   HENT: Negative.   Eyes: Negative.   Respiratory: Negative.   Cardiovascular: Negative.   Gastrointestinal: Negative.   Endocrine: Negative.   Genitourinary: Negative.   Musculoskeletal:       Right foot pain  Allergic/Immunologic: Negative.   Neurological: Negative.   Hematological: Negative.   Psychiatric/Behavioral: Negative.        Objective:   Physical Exam  Constitutional: She appears well-developed and well-nourished. No distress.  HENT:  Head: Normocephalic and atraumatic.  Eyes:  Conjunctivae and EOM are normal. Pupils are equal, round, and reactive to light.  Neck: Normal range of motion. Neck supple. No tracheal deviation present.  Pulmonary/Chest: Breath sounds normal.  Musculoskeletal:       Right ankle: She exhibits normal range of motion, no swelling and no deformity. No tenderness.       Left ankle: She exhibits normal range of motion, no swelling and no deformity. No tenderness.       Right foot: There is tenderness (Mild tenderness to deep palpation over 4th MT). There is normal range of motion and no swelling (trace forefoot swellling when compared to the left. Good visualization of extensor tendons  on dorsum of right foot).       Left foot: There is normal range of motion, no tenderness and no swelling.  No skin changes over right or left foot. Plantar flexion/Dorsiflexion 5/5 bilaterally. Sensation to light touch intact distally and equally b/l. MT squeeze test negative on right side (no pain reproduced)  Skin: She is not diaphoretic.    Ultrasound: Compared images today to images of previous exam in November. Bony deformity over the 4th MT is not currently present. Doppler showed trace blood flow around the bone (much lower than previous) and area of fluid accumulation much improved from previous study.      Assessment & Plan:   #Healing Stress Fracture of Right 4th Metatarsal with pain As evidenced by pain on physical exam  and Ultrasound. Patient is about 2 months out from initial onset of pain and will likely only need a couple of more weeks of rest for complete healing. Instructed patient that if after 2 weeks when she slowly returns to activity if the pain returns to take an additional 2 weeks off and then she should be 100% healed. -Continue relative rest for 2 weeks -Followed by slow return to activity -May need to take a total of 4 weeks rest -RTC PRN or if symptoms fail to improve

## 2017-10-30 ENCOUNTER — Encounter: Payer: Self-pay | Admitting: Sports Medicine

## 2017-10-30 ENCOUNTER — Ambulatory Visit (INDEPENDENT_AMBULATORY_CARE_PROVIDER_SITE_OTHER): Payer: 59 | Admitting: Sports Medicine

## 2017-10-30 ENCOUNTER — Ambulatory Visit: Payer: Self-pay

## 2017-10-30 VITALS — BP 115/84 | Ht 65.0 in | Wt 119.0 lb

## 2017-10-30 DIAGNOSIS — M75100 Unspecified rotator cuff tear or rupture of unspecified shoulder, not specified as traumatic: Secondary | ICD-10-CM | POA: Diagnosis not present

## 2017-10-30 DIAGNOSIS — M25511 Pain in right shoulder: Secondary | ICD-10-CM

## 2017-10-30 MED ORDER — NITROGLYCERIN 0.2 MG/HR TD PT24: MEDICATED_PATCH | TRANSDERMAL | 1 refills | Status: AC

## 2017-10-30 NOTE — Patient Instructions (Addendum)
You have a non-retracted supraspinatus tear. Please follow up in 6 weeks     Nitroglycerin Protocol   Apply 1/4 nitroglycerin patch to affected area daily.  Change position of patch within the affected area every 24 hours.  You may experience a headache during the first 1-2 weeks of using the patch, these should subside.  If you experience headaches after beginning nitroglycerin patch treatment, you may take your preferred over the counter pain reliever.  Another side effect of the nitroglycerin patch is skin irritation or rash related to patch adhesive.  Please notify our office if you develop more severe headaches or rash, and stop the patch.  Tendon healing with nitroglycerin patch may require 12 to 24 weeks depending on the extent of injury.  Men should not use if taking Viagra, Cialis, or Levitra.   Do not use if you have migraines or rosacea.
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
## 2017-11-01 ENCOUNTER — Encounter: Payer: Self-pay | Admitting: Sports Medicine

## 2017-11-01 DIAGNOSIS — M75100 Unspecified rotator cuff tear or rupture of unspecified shoulder, not specified as traumatic: Secondary | ICD-10-CM | POA: Insufficient documentation

## 2017-11-01 NOTE — Assessment & Plan Note (Signed)
Since the tear is small we will try conservative care  ROM exercises HEP PT RX given NTG protocol  Reck 4 to 6 weeks with repeat [place]

## 2017-11-29 ENCOUNTER — Ambulatory Visit (INDEPENDENT_AMBULATORY_CARE_PROVIDER_SITE_OTHER): Payer: 59 | Admitting: Sports Medicine

## 2017-11-29 ENCOUNTER — Encounter: Payer: Self-pay | Admitting: Sports Medicine

## 2017-11-29 DIAGNOSIS — M75100 Unspecified rotator cuff tear or rupture of unspecified shoulder, not specified as traumatic: Secondary | ICD-10-CM | POA: Diagnosis not present

## 2017-11-29 NOTE — Progress Notes (Signed)
HPI  CC: Right shoulder follow-up Patient is here to follow-up regarding her right shoulder.  She states that about 2 months ago she sustained a injury to her right shoulder.  She was seen here last month and diagnosed with a supraspinatus partial tear.  At that visit she was given nitroglycerin patches and exercises.  She endorses good compliance with these (especially the patches), and has since had significant improvement in her pain.  Range of motion is persistently diminished but per patient, is very much improved from what it had been 1 month ago.  She denies any new trauma, injury, or setbacks.  No new weakness, numbness, or paresthesias.  Medications/Interventions Tried: Nitroglycerin patches, HEP, RICE  See HPI and/or previous note for associated ROS.  Objective: BP 102/68   Ht 5' 5.5" (1.664 m)   Wt 118 lb (53.5 kg)   BMI 19.34 kg/m  Gen: NAD, well groomed, a/o x3, normal affect.  CV: Well-perfused. Warm.  Resp: Non-labored.  Neuro: Sensation intact throughout. No gross coordination deficits.  Gait: Nonpathologic posture, unremarkable stride without signs of limp or balance issues. Shoulder, Right: TTP noted at the superior lateral aspect of the shoulder. No evidence of bony deformity, asymmetry, or muscle atrophy; No tenderness over long head of biceps (bicipital groove). No TTP at [facility] joint.  Diminished active ROM in abduction beyond 90. Full passive ROM, with the exception of IR only to ~95 with impingement symptoms reported. Strength 5/5 in Flex/ER/IR, 4/5 in abduction (No abduction limitations with thumb up, but when thumb is down and supraspinatus is isolated, strength is lost). No abnormal scapular function observed. Sensation intact. Peripheral pulses intact.  Special Tests:   - Crossarm test: NEG   - Empty can: Positive   - Drop Arm: Positive   - Hawkins: NEG   - Neer test: NEG   - Obrien's test: Positive   - Yergason's: NEG   - Speeds test: NEG   - Crank test:  NEG   - Apprehension test: NEG  ULTRASOUND: Shoulder, Right  Diagnostic complete ultrasound imaging obtained of patient's Right shoulder.  - No obvious evidence of bony deformity or osteophyte development appreciated.  - Long head of the biceps tendon: No evidence of tendon thickening, calcification, subluxation, or tearing in short or long axis views. No edema or bullseye sign.  - Subscapularis tendon: complete visualization across the width of the insertion point yielded no evidence of tendon thickening, calcification, or tears in the long axis view.  - Supraspinatus tendon: complete visualization across the width of the insertion point yielded evidence of partial, posteriorly located tendonous tearing with scar tissue (and possible calcification) development at the site of injury. ~0.75cm retraction present.   - Infraspinatus and teres minor tendons: visualization across the width of the insertion points yielded no evidence of tendon thickening, calcification, or tears in the long axis view.  There was some bony irregularity at the distal most aspect of the foot plate that may represent a prior bony Bankart injury. [facility] Joint: No evidence of joint separation, collapse, or osteophyte development appreciated. No effusion present.  IMPRESSION: findings consistent with - Partial supraspinatus tear with evidence of possible healing/scar formation. - Possible old bony Bankhart lesion near the distal footplate of the infraspinatus   Assessment and plan:  Tear of tendon of rotator cuff Follow-up regarding her supraspinatus tear shows soft tissue healing and scar formation.  Tenderness retraction approximately 0.75 cm today.  Range of motion and discomfort significantly improved.  Continues to have substantial weakness regarding any supraspinatus motion. -We will continue conservative treatment at this time. -Continue nitroglycerin patches. -Continue range of motion exercises. -Rotator cuff  exercises provided today with low resistance Thera-Band. -Prescription for formal physical therapy provided today as well. -Follow-up 4-6 weeks.   Kathee DeltonIan D Stephnie Parlier, MD,MS [facility] 11/29/2017 6:34 PM

## 2017-11-29 NOTE — Assessment & Plan Note (Signed)
Follow-up regarding her supraspinatus tear shows soft tissue healing and scar formation.  Tenderness retraction approximately 0.75 cm today.  Range of motion and discomfort significantly improved.  Continues to have substantial weakness regarding any supraspinatus motion. -We will continue conservative treatment at this time. -Continue nitroglycerin patches. -Continue range of motion exercises. -Rotator cuff exercises provided today with low resistance Thera-Band. -Prescription for formal physical therapy provided today as well. -Follow-up 4-6 weeks.

## 2018-07-02 ENCOUNTER — Encounter: Payer: Self-pay | Admitting: Sports Medicine

## 2018-07-02 ENCOUNTER — Ambulatory Visit
Admission: RE | Admit: 2018-07-02 | Discharge: 2018-07-02 | Disposition: A | Discharge status: Home / Self Care | Payer: 59 | Source: Ambulatory Visit | Attending: Sports Medicine | Admitting: Sports Medicine

## 2018-07-02 ENCOUNTER — Ambulatory Visit (INDEPENDENT_AMBULATORY_CARE_PROVIDER_SITE_OTHER): Payer: 59 | Admitting: Sports Medicine

## 2018-07-02 VITALS — BP 104/68 | Ht 65.5 in | Wt 118.0 lb

## 2018-07-02 DIAGNOSIS — M542 Cervicalgia: Secondary | ICD-10-CM | POA: Diagnosis not present

## 2018-07-02 DIAGNOSIS — G8929 Other chronic pain: Secondary | ICD-10-CM

## 2018-07-02 DIAGNOSIS — M222X1 Patellofemoral disorders, right knee: Secondary | ICD-10-CM | POA: Insufficient documentation

## 2018-07-02 NOTE — Assessment & Plan Note (Addendum)
Reports 1 month of right knee pain associated with hiking injury.  Exam largely normal without signs of swelling.  No mechanical symptoms reported. Suspect likely patellofemoral syndrome, have recommended stationary bike for rehab.  May continue swimming and light running, avoiding downhill.  Instructed to do plantarflexed mini knee bends at home.  May use compression brace if she desires for additional support.

## 2018-07-02 NOTE — Assessment & Plan Note (Signed)
Has history of chronic left sided neck pain following a horse-riding injury several years ago and has found some improvement with manipulation, home exercises and massage.  Endorses radicular symptoms into left arm with occasional numbness and tingling.  She has previously had imaging however unable to see these in her chart from outside records.  Will obtain plain films of her cervical spine for further evaluation.

## 2018-07-02 NOTE — Progress Notes (Signed)
Subjective:   Patient ID: Alexandria Huffman    DOB: July 21, 1966, 52 y.o. female   MRN: [id number]  CC: right knee pain, f/u chronic neck pain   HPI: Alexandria Huffman is a 52 y.o. female who presents to clinic today for right knee pain and chronic neck pain.   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  She states when climbing down she landed hard on her hyperflexed right knee and felt a twinge through her kneecap.   Did not hear or feel a pop.  She is able to run on flat surface without difficulty and once she has been running for a while, hurts most when starting and stopping.   Feels some pain and instability when running downhill so she has avoided that. Pain is 2/10 at best, 5/10 at worst, does not radiate.  Denies swelling, numbness and tingling. She has been applying ice which helps, as well as doing strengthening exercises at home such as lunges and squats.  She has tried magne sport balm and massaged the area which provides some relief.  She has bought a compression sleeve but has not worn this yet.  No mechanical symptoms of catching, locking.     Chronic neck pain Has had chronic left sided neck pain for several years.  She has been told she has C5-C6 cervical stenosis.  She endorses tightness in her muscles, at times has numbness and tingling that radiates into her left arm.  She has tried massage, manipulation and home exercises which have provided some improvement.  Has had prior imaging which is not seen in her chart from outside records.   ROS: No warmth, swelling, weakness.  +occasional numbness and tingling in left arm.   PMFSH: Pertinent past medical, surgical, family, and social history were reviewed and updated as appropriate. Smoking status reviewed. Medications reviewed. Objective:   BP 104/68   Ht 5' 5.5" (1.664 m)   Wt 118 lb (53.5 kg)   BMI 19.34 kg/m  Vitals and nursing  note reviewed.  General: 52 yo female, NAD   Neck: No obvious deformity, no swelling or erythema.  ROM limited with left lateral bending and lateral rotation to the left.  Exam notable for mild head forward position and bilateral scapular protraction without scapular dyskinesis. Mild TTP over left trapezius musculature.   Right knee: Normal to inspection with no erythema or effusion or obvious bony abnormalities.  Palpation normal with no warmth or joint line tenderness or patellar or condyle tenderness. ROM normal in flexion and extension and lower leg rotation. Ligaments with solid consistent endpoints including ACL, PCL, LCL, MCL. Negative Mcmurray's and provocative meniscal tests. Non painful patellar compression.  Patellar and quadriceps tendons unremarkable. Hamstring and quadriceps strength is normal.  Skin: warm, dry, no rash  Neuro: alert, oriented x3, no focal deficits, motor strength 5/5 bilaterally in upper and lower extremities, sensation intact, normal gait.   Assessment & Plan:   Patellofemoral pain syndrome of right knee Reports 1 month of right knee pain associated with hiking injury.  Exam largely normal without signs of swelling.  No mechanical symptoms reported. Suspect likely patellofemoral syndrome, have recommended stationary bike for rehab.  May continue swimming and light running, avoiding downhill.  Instructed to do plantarflexed mini knee bends at home.  May use compression brace if she desires for additional support.   Chronic neck pain Has  history of chronic left sided neck pain following a horse-riding injury several years ago and has found some improvement with manipulation, home exercises and massage.  Endorses radicular symptoms into left arm with occasional numbness and tingling.  She has previously had imaging however unable to see these in her chart from outside records.  Will obtain plain films of her cervical spine for further evaluation.    Orders  Placed This Encounter  Procedures  . DG Cervical Spine Complete    Standing Status:   Future    Number of Occurrences:   1    Standing Expiration Date:   09/02/2019    Order Specific Question:   Reason for Exam (SYMPTOM  OR DIAGNOSIS REQUIRED)    Answer:   chronic neck pain    Order Specific Question:   Is patient pregnant?    Answer:   No    Order Specific Question:   Preferred imaging location?    Answer:   [facility]    Order Specific Question:   Radiology Contrast Protocol - do NOT remove file path    Answer:   [email or web address]   Freddrick March, MD [place] PGY-3  Addendum: CX spine films reviewed by me. Primary changes are DDD at C5/6 and C6/7 as well as foraminal narrowing that is bilat. In lower CS spine  I observed and examined the patient with the resident and agree with assessment and plan.  Note reviewed and modified by me. Sterling Big, MD

## 2018-10-29 ENCOUNTER — Ambulatory Visit (INDEPENDENT_AMBULATORY_CARE_PROVIDER_SITE_OTHER): Payer: 59 | Admitting: Sports Medicine

## 2018-10-29 ENCOUNTER — Encounter: Payer: Self-pay | Admitting: Sports Medicine

## 2018-10-29 ENCOUNTER — Ambulatory Visit: Payer: Self-pay

## 2018-10-29 VITALS — BP 114/71 | Ht 65.5 in | Wt 118.0 lb

## 2018-10-29 DIAGNOSIS — M222X1 Patellofemoral disorders, right knee: Secondary | ICD-10-CM | POA: Diagnosis not present

## 2018-10-29 NOTE — Progress Notes (Signed)
HPI  CC: Right knee pain in competitive triathlete  Alexandria Huffman is a 53 year old female who presents for right knee pain.  She states the pain started around 2 months ago.  She states that she notices it during the first 1 to 2 miles of her warm up.  She states that at that time the pain goes away.  She states is made worse when she goes downhill.  She states she feels her knee is out of alignment she feels a  Grinding sensation.  She states she been running 20 miles a week training for a triathlon.  She denies any swelling in the knee.  She denies any numbness and tingling in her leg.  She denies any weakness.  She denies any locking or catching.  She denies any inciting event.  She has no prior knee injury to this area.  She has never taken any medication.  She has no prior surgical history.  Past Hx;  RT hip labrum RC tear partial  Soc.Hx: training with Alexandria Huffman/ trying to qualify world half ironman championships 2 children?  See HPI and/or previous note for associated ROS.  Objective: BP 114/71   Ht 5' 5.5" (1.664 m)   Wt 118 lb (53.5 kg)   BMI 19.34 kg/m  Gen: NAD, well groomed, a/o x3, normal affect.  CV: Well-perfused. Warm.  Resp: Non-labored.  Neuro: Sensation intact throughout. No gross coordination deficits.  Gait: Nonpathologic posture, unremarkable stride without signs of limp or balance issues.  Right knee exam: No erythema, warmth, swelling noted.  Tenderness palpation over the medial patella, quad tendon.  Full range of motion knee flexion knee extension.  Some crepitus noted with knee extension.  J sign present with lateral tracking of kneecap.  Strength 5 out of 5 throughout testing.  Knee valgus with step downs noted.  Negative Lockman, negative posterior drawer, negative valgus stress test, negative varus stress test, negative McMurray test.  ULTRASOUND: Knee, right Diagnostic limited ultrasound imaging obtained of patient's right knee.  - Quadriceps tendon: No  appreciated signs of tearing, edema, or calcification. No (compressible) fluid/edema noted within the suprapatellar pouch.  - Patellar tendon: No appreciated signs of tearing, edema, or calcification. No infrapatellar or tibial tuberosity fluid or abnormality appreciated.  - Medial joint line: No signs concerning for meniscal pathology appreciated. No increased fluid presence noted. No evidence of osteophyte development or significant joint space loss.  - Lateral joint line: No signs concerning for meniscal pathology appreciated. No increased fluid presence noted. No evidence of osteophyte development or significant joint space loss.  -Trochlear groove without any signs of degenerative change. -Some calcifications noted on the superior sub-patella./ trochlear groove IMPRESSION: findings consistent with calcific changes on undersurface of patella.  Ultrasound and interpretation by Alexandria QuanBlake Dixon, MD and Alexandria ParrKarl B. Paco Cislo, MD   Assessment and plan: Patellofemoral syndrome, right knee  We discussed treatment options at today's visit.  We provided her with home exercises at today's visit.  We will have her start with the VMO strengthening exercises with inside leg lifts, leg extensions, and step downs.  She has good hip abduction strength on today's exam.  She also has good hamstring flexibility.  I believe that with these measures she should has some decrease in the pain on the anterior knee.  We will see her back for follow-up in 6 weeks.  If she has no resolution of her symptoms, I will consider an MRI at that time.  Alexandria QuanBlake Dixon, MD [facility] Fellow 10/29/2018 1:28 PM   I observed and examined the patient with the [facility] Fellow and agree with assessment and plan.  Note reviewed and modified by me. Alexandria Baas, MD

## 2019-10-21 ENCOUNTER — Ambulatory Visit: Payer: BLUE CROSS/BLUE SHIELD | Attending: Internal Medicine

## 2019-10-21 DIAGNOSIS — Z20822 Contact with and (suspected) exposure to covid-19: Secondary | ICD-10-CM

## 2019-10-22 LAB — NOVEL CORONAVIRUS, NAA: SARS-CoV-2, NAA: NOT DETECTED

## 2020-03-18 ENCOUNTER — Ambulatory Visit (INDEPENDENT_AMBULATORY_CARE_PROVIDER_SITE_OTHER): Payer: BLUE CROSS/BLUE SHIELD | Admitting: Sports Medicine

## 2020-03-18 ENCOUNTER — Other Ambulatory Visit: Payer: Self-pay

## 2020-03-18 DIAGNOSIS — M25572 Pain in left ankle and joints of left foot: Secondary | ICD-10-CM

## 2020-03-18 DIAGNOSIS — M722 Plantar fascial fibromatosis: Secondary | ICD-10-CM | POA: Diagnosis not present

## 2020-03-18 NOTE — Assessment & Plan Note (Signed)
She was given sports insoles Scaphoid pad on the left Arch strap on the left Icing Modification of training Calf exercises Plantar fascia stretches  Recheck after 6 weeks if not resolving

## 2020-03-18 NOTE — Progress Notes (Signed)
Chief complaint left foot and heel pain  Patient completed a half Ironman race this past weekend 4 days ago She had some mild foot pain going into the race and had the arch taped Her pain and for started coming with long runs She had mild pain first step out of bed in the morning After completing the half marathon run the foot has been pretty sore She also ran in a [place] shoe with less heel elevation  Review of systems  no swelling No pain while sleeping  Physical exam Athletic female in no acute distress BP (!) 107/40   Ht 5' 5.5" (1.664 m)   Wt 130 lb (59 kg)   BMI 21.30 kg/m   She has a very well preserved foot and arch She has some direct tenderness at the medial insertion of the plantar fascia into the calcaneus of the left foot There is some mild tenderness along the longitudinal arch on the left No swelling or discoloration is observed She has good great toe motion Good calf strength  Point-of-care ultrasound screen showed thickening of the plantar fascia at 0.67 cm Some mild hypoechoic change

## 2020-06-08 ENCOUNTER — Other Ambulatory Visit: Payer: Self-pay

## 2020-06-08 ENCOUNTER — Ambulatory Visit (INDEPENDENT_AMBULATORY_CARE_PROVIDER_SITE_OTHER): Payer: BLUE CROSS/BLUE SHIELD | Admitting: Sports Medicine

## 2020-06-08 VITALS — BP 108/68 | Ht 65.0 in | Wt 123.0 lb

## 2020-06-08 DIAGNOSIS — M67979 Unspecified disorder of synovium and tendon, unspecified ankle and foot: Secondary | ICD-10-CM

## 2020-06-08 DIAGNOSIS — M25512 Pain in left shoulder: Secondary | ICD-10-CM

## 2020-06-09 NOTE — Progress Notes (Signed)
Subjective:    Patient ID: Alexandria Huffman, female    DOB: [birth year], 54 y.o.   MRN: [id number]  HPI chief complaint left shoulder and left ankle pain  Alexandria Huffman comes in today with a couple of different complaints.  First complaint is left shoulder pain.  She has had problems with this shoulder in the past.  She is a very active triathlete and has had pain with her shoulder particularly with swimming.  She began to notice some slight decreased range of motion which she has been working on but had an acute on chronic episode of pain a few days ago after a shoulder adjustment.  Pain was severe enough that she was unable to raise her arm above her head.  That pain has resolved and she is back to her chronic discomfort. Her main complaint today is left ankle and foot pain.  She has medial sided left ankle and foot pain which has been severe enough that she has been unable to run.  She has been dealing with this for several months.  She has been working with a Industrial/product designer getting treatment and that has been helpful.  She is getting ready to resume running again on a zero gravity treadmill.  She endorses her pain along the course of the peroneal tendon.  She has a history of ankle sprains with the most recent one being in November.  She was initially having pain with walking but that has resolved.  Overall she feels like her symptoms are improving but slowly.  Past medical history reviewed Medications reviewed Allergies reviewed    Review of Systems As above    Objective:   Physical Exam  Well-developed, well-nourished.  No acute distress.  Awake alert and oriented x3.  Vital signs reviewed  Left shoulder: Patient has slightly limited range of motion at the extremes of external rotation, abduction, and forward flexion when compared to the right.  Positive empty can, positive Hawkins.  She does have 5/5 rotator cuff strength but it does reproduce pain with resisted supraspinatus.  She is  neurovascularly intact distally.  Left foot and ankle: Full range of motion.  No ankle effusion.  There is no soft tissue swelling.  No tenderness to palpation today.  She has a flexible cavus foot.  Mildly positive anterior drawer, mildly positive talar tilt.  Good pulses.  Neurovascularly intact distally.      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  I encouraged her to continue to work on range of motion.  She will continue working with her chiropractor. She has also worked with Ellamae Sia in the past.  If symptoms persist, I recommend a follow-up appointment for a complete left shoulder ultrasound.  For her posterior tibialis tendinopathy I have recommended that we try custom orthotics.  She is a very active triathlete and I think the arch support and cushioning from a custom orthotic may benefit her.  I would also like for her to try a body helix compression sleeve and start eccentric posterior tibialis tendon exercises.  She will follow up with me in the office sometime in the next week or 2 for custom orthotics we will plan a follow-up visit for around 6 weeks after that.  She will also go ahead and  start training on a zero gravity treadmill.  Alexandria Huffman is in agreement with that plan.

## 2020-06-17 ENCOUNTER — Ambulatory Visit (INDEPENDENT_AMBULATORY_CARE_PROVIDER_SITE_OTHER): Payer: BLUE CROSS/BLUE SHIELD | Admitting: Sports Medicine

## 2020-06-17 ENCOUNTER — Other Ambulatory Visit: Payer: Self-pay

## 2020-06-17 VITALS — BP 102/62 | Ht 65.5 in | Wt 125.0 lb

## 2020-06-17 DIAGNOSIS — Q667 Congenital pes cavus, unspecified foot: Secondary | ICD-10-CM | POA: Diagnosis not present

## 2020-06-17 DIAGNOSIS — M79672 Pain in left foot: Secondary | ICD-10-CM | POA: Diagnosis not present

## 2020-06-17 NOTE — Progress Notes (Signed)
   Office Visit Note   Patient: Alexandria Huffman           Date of Birth: 1965-11-19           MRN: [id number] Visit Date: 06/17/2020 Requested by: Dois Davenport, MD [address] [place],  [place] [address] PCP: Dois Davenport, MD  Subjective: CC: Custom Orthotics Fitting  HPI: Patient is a 54 yo F, avid runner and triathalete, who is presenting to clinic for custom orthotics fitting. She has a history of Pes Cavus, which has been causing persistent symptoms on the left. At previous encounter, patient was given temporary inserts, which she states did offer improvement in her foot pain. She would like to advance to customs today.               ROS:   All other systems were reviewed and are negative.  Objective: Vital Signs: BP 102/62   Ht 5' 5.5" (1.664 m)   Wt 125 lb (56.7 kg)   BMI 20.48 kg/m   Physical Exam:  General:  Alert and oriented, in no acute distress. Pulm:  Breathing unlabored. Psy:  Normal mood, congruent affect. MSK: Normal gait. Bilateral feet with pes cavus deformity.   Imaging: No results found.  Assessment & Plan: 54 year old female presenting to clinic for custom orthotics fitting.  Orthotics were molded to patient's foot, and shaped to fit her running shoes.  After fitting, patient voiced comfort in her insoles.  -Recommended she return to clinic should she experience any discomfort while running in her provided orthotics, and adjustments can be made.  -Return to clinic in 6 weeks to follow-up on foot and ankle pain.  -Patient no further questions or concerns today.     Procedures: Patient was fitted for a : standard, cushioned, semi-rigid orthotic. The orthotic was heated and afterward the patient stood on the orthotic blank positioned on the orthotic stand. The patient was positioned in subtalar neutral position and 10 degrees of ankle dorsiflexion in a weight bearing stance. After completion of molding, a stable base was applied to the  orthotic blank. The blank was ground to a stable position for weight bearing. Size: 9 Base: EVA Additional orthotic padding: N/A  Patient seen and evaluated with the sports medicine fellow.  I agree with the above plan of care.  Custom orthotics created as above.  Return to clinic in 6 weeks for follow-up on her posterior tibialis tendinopathy.  Continue with compression when exercising and continue with eccentric home exercise program.

## 2020-07-29 ENCOUNTER — Ambulatory Visit: Payer: BLUE CROSS/BLUE SHIELD | Admitting: Sports Medicine

## 2021-07-19 ENCOUNTER — Encounter: Payer: Self-pay | Admitting: Sports Medicine

## 2021-07-19 ENCOUNTER — Ambulatory Visit (INDEPENDENT_AMBULATORY_CARE_PROVIDER_SITE_OTHER): Payer: BLUE CROSS/BLUE SHIELD | Admitting: Sports Medicine

## 2021-07-19 ENCOUNTER — Ambulatory Visit
Admission: RE | Admit: 2021-07-19 | Discharge: 2021-07-19 | Disposition: A | Discharge status: Home / Self Care | Payer: BLUE CROSS/BLUE SHIELD | Source: Ambulatory Visit | Attending: Sports Medicine | Admitting: Sports Medicine

## 2021-07-19 VITALS — Ht 65.5 in | Wt 125.0 lb

## 2021-07-19 DIAGNOSIS — M533 Sacrococcygeal disorders, not elsewhere classified: Secondary | ICD-10-CM

## 2021-07-19 DIAGNOSIS — M545 Low back pain, unspecified: Secondary | ICD-10-CM | POA: Diagnosis not present

## 2021-07-19 DIAGNOSIS — G8929 Other chronic pain: Secondary | ICD-10-CM | POA: Diagnosis not present

## 2021-07-19 NOTE — Assessment & Plan Note (Signed)
Physical exam and history consistent with pain in coccyx.  We will complete x-rays of both coccyx and sacrum to evaluate for any fracture as injury was over a week ago.  Discussed that if x-rays are negative patient can continue with her event 2 weeks from now.  Discussed that if sacral fracture that patient is not advised to compete.  If simply bruised coccyx, stated that patient will need time to heal but is okay to continue with her exercise any events as long as it does not cause pain.  Patient in agreement with this plan.

## 2021-07-19 NOTE — Progress Notes (Signed)
   PCP: Dois Davenport, MD  Subjective:   HPI: Patient is a 55 y.o. female here for lumbar back pain following an injury during a fall.  Patient reports falling while flexing her right hip and knee while getting off of her bike.  Patient reports that she is training for a triathlon that is a little over 2 weeks away.  She reports that the fall occurred 1.5 weeks ago.  Patient states that she had lumbar back pain that has now improved.  She is concerned that she may have either bruised or fractured her tailbone.  She states that she has been stretching, doing yoga, visiting the chiropractor, using a massage gun, using heat and ice as well as cupping to help with the pain.  She reports that limited movements that involve spinal flexion and some degree of rotation and reproduce the pain.  She has been limiting the amount of Advil that she takes.  She has been able to complete a 5 mile ride on her road bike but has not used her triathlon bike as of yet.  She reports having x-rays done with her chiropractor did not show abnormalities in relation to her coccyx but states that her left hip appears to be higher in relation to her right.       Objective:  Physical Exam:  Gen: awake, alert, NAD, comfortable in exam room Pulm: breathing unlabored  Back: No gross deformity, scoliosis. TTP in area of coccyx .  No midline or bony TTP to lumbar or thoracic spine, no greater trochanteric pain  FROM. Strength LEs 5/5 all muscle groups.   Negative SLRs bilaterally. Sensation intact to light touch bilaterally. Negative fabers and piriformis stretches.  Assessment & Plan:    Coccyx pain Physical exam and history consistent with pain in coccyx.  We will complete x-rays of both coccyx and sacrum to evaluate for any fracture as injury was over a week ago.  Discussed that if x-rays are negative patient can continue with her event 2 weeks from now.  Discussed that if sacral fracture that patient is not  advised to compete.  If simply bruised coccyx, stated that patient will need time to heal but is okay to continue with her exercise any events as long as it does not cause pain.  Patient in agreement with this plan.   Orders Placed This Encounter  Procedures   DG Sacrum/Coccyx    Standing Status:   Future    Standing Expiration Date:   07/19/2022    Order Specific Question:   Reason for Exam (SYMPTOM  OR DIAGNOSIS REQUIRED)    Answer:   low back pain; 2 view    Order Specific Question:   Is patient pregnant?    Answer:   No    Order Specific Question:   Preferred imaging location?    Answer:   [facility]    No orders of the defined types were placed in this encounter.   Ronnald Ramp, MD [facility] Family Medicine, PGY-3 07/19/2021 4:22 PM  Patient seen and evaluated with the resident.  I agree with the above plan of care.  X-rays do not show any obvious fracture of the sacrum or coccyx.  I think Alexandria Huffman is okay to continue with activity as tolerated.  Reassurance regarding x-rays.  Follow-up as needed.

## 2022-03-02 ENCOUNTER — Other Ambulatory Visit: Payer: Self-pay | Admitting: Otolaryngology

## 2022-03-02 DIAGNOSIS — R221 Localized swelling, mass and lump, neck: Secondary | ICD-10-CM

## 2022-03-10 ENCOUNTER — Other Ambulatory Visit: Payer: BLUE CROSS/BLUE SHIELD

## 2022-03-27 ENCOUNTER — Ambulatory Visit
Admission: RE | Admit: 2022-03-27 | Discharge: 2022-03-27 | Disposition: A | Discharge status: Home / Self Care | Payer: Commercial Managed Care - HMO | Source: Ambulatory Visit | Attending: Otolaryngology | Admitting: Otolaryngology

## 2022-03-27 DIAGNOSIS — R221 Localized swelling, mass and lump, neck: Secondary | ICD-10-CM

## 2022-03-27 MED ORDER — IOPAMIDOL (ISOVUE-300) INJECTION 61%
75.0000 mL | Freq: Once | INTRAVENOUS | Status: AC | PRN
  Administered 2022-03-27: 75 mL via INTRAVENOUS

## 2022-03-31 ENCOUNTER — Other Ambulatory Visit (HOSPITAL_COMMUNITY): Payer: Self-pay | Admitting: Otolaryngology

## 2022-03-31 ENCOUNTER — Encounter: Payer: Self-pay | Admitting: *Deleted

## 2022-03-31 ENCOUNTER — Other Ambulatory Visit: Payer: Self-pay | Admitting: Otolaryngology

## 2022-03-31 DIAGNOSIS — R221 Localized swelling, mass and lump, neck: Secondary | ICD-10-CM

## 2022-03-31 NOTE — Progress Notes (Unsigned)
Oley Balm, MD  Marylynn Pearson Ok   [place] FNA or core R station II cystic LAN  See CT neck Im 50 Se 2   DDH

## 2022-04-05 ENCOUNTER — Other Ambulatory Visit: Payer: Self-pay | Admitting: Internal Medicine

## 2022-04-05 DIAGNOSIS — R221 Localized swelling, mass and lump, neck: Secondary | ICD-10-CM

## 2022-04-06 ENCOUNTER — Ambulatory Visit (HOSPITAL_COMMUNITY)
Admission: RE | Admit: 2022-04-06 | Discharge: 2022-04-06 | Disposition: A | Discharge status: Home / Self Care | Payer: Commercial Managed Care - HMO | Source: Ambulatory Visit | Attending: Otolaryngology | Admitting: Otolaryngology

## 2022-04-06 ENCOUNTER — Encounter (HOSPITAL_COMMUNITY): Payer: Self-pay | Admitting: *Deleted

## 2022-04-06 DIAGNOSIS — L570 Actinic keratosis: Secondary | ICD-10-CM | POA: Insufficient documentation

## 2022-04-06 DIAGNOSIS — R221 Localized swelling, mass and lump, neck: Secondary | ICD-10-CM | POA: Diagnosis present

## 2022-04-06 HISTORY — DX: Unspecified disorder of synovium and tendon, unspecified site: M67.90

## 2022-04-06 LAB — CBC WITH DIFFERENTIAL/PLATELET
Abs Immature Granulocytes: 0.01 10*3/uL (ref 0.00–0.07)
Basophils Absolute: 0.1 10*3/uL (ref 0.0–0.1)
Basophils Relative: 1 %
Eosinophils Absolute: 0.1 10*3/uL (ref 0.0–0.5)
Eosinophils Relative: 3 %
HCT: 44.3 % (ref 36.0–46.0)
Hemoglobin: 15.1 g/dL — ABNORMAL HIGH (ref 12.0–15.0)
Immature Granulocytes: 0 %
Lymphocytes Relative: 37 %
Lymphs Abs: 1.7 10*3/uL (ref 0.7–4.0)
MCH: 30.9 pg (ref 26.0–34.0)
MCHC: 34.1 g/dL (ref 30.0–36.0)
MCV: 90.8 fL (ref 80.0–100.0)
Monocytes Absolute: 0.5 10*3/uL (ref 0.1–1.0)
Monocytes Relative: 10 %
Neutro Abs: 2.3 10*3/uL (ref 1.7–7.7)
Neutrophils Relative %: 49 %
Platelets: 265 10*3/uL (ref 150–400)
RBC: 4.88 MIL/uL (ref 3.87–5.11)
RDW: 12.7 % (ref 11.5–15.5)
WBC: 4.7 10*3/uL (ref 4.0–10.5)
nRBC: 0 % (ref 0.0–0.2)

## 2022-04-06 LAB — PROTIME-INR
INR: 0.9 (ref 0.8–1.2)
Prothrombin Time: 11.8 seconds (ref 11.4–15.2)

## 2022-04-06 MED ORDER — SODIUM CHLORIDE 0.9 % IV SOLN: INTRAVENOUS | Status: DC

## 2022-04-06 MED ORDER — MIDAZOLAM HCL 2 MG/2ML IJ SOLN
INTRAMUSCULAR | Status: AC
  Filled 2022-04-06: qty 4

## 2022-04-06 MED ORDER — LIDOCAINE HCL 1 % IJ SOLN
INTRAMUSCULAR | Status: AC
  Filled 2022-04-06: qty 10

## 2022-04-06 MED ORDER — FENTANYL CITRATE (PF) 100 MCG/2ML IJ SOLN
INTRAMUSCULAR | Status: AC | PRN
  Administered 2022-04-06: 50 ug via INTRAVENOUS

## 2022-04-06 MED ORDER — FENTANYL CITRATE (PF) 100 MCG/2ML IJ SOLN
INTRAMUSCULAR | Status: AC
  Filled 2022-04-06: qty 2

## 2022-04-06 MED ORDER — MIDAZOLAM HCL 2 MG/2ML IJ SOLN
INTRAMUSCULAR | Status: AC | PRN
  Administered 2022-04-06: 1 mg via INTRAVENOUS

## 2022-04-06 MED ORDER — LIDOCAINE HCL 1 % IJ SOLN
INTRAMUSCULAR | Status: AC
  Filled 2022-04-06: qty 20

## 2022-04-06 NOTE — Procedures (Signed)
Interventional Radiology Procedure Note  Procedure: US guided FNA right cervical lymph node. Mx FNA  Complications: None EBL: None  Recommendations: - Bedrest 1 hours, for sedation.   - Routine wound care - Follow up pathology - Advance diet   Signed,  Gilmer Mor, DO

## 2022-04-06 NOTE — Consult Note (Addendum)
Chief Complaint: Patient was seen in consultation today for US guided biopsy of right neck cystic lymph node  Referring Physician(s): Teoh,Su  Supervising Physician: Mosie Epstein  Patient Status: [facility] - Out-pt  History of Present Illness: Alexandria Huffman is a 56 y.o. female nonsmoker with no sig PMH who presents now with about 2 month hx right neck mass. No hx malignancy. CT performed on 03/28/22 revealed:  1. Abnormal, cystic appearing right level II lymph node, indeterminate although metastatic disease is a concern (versus an infectious etiology). 2. No definite primary neck mass is identified, however there is slight asymmetric fullness of the right palatine tonsil and direct visualization is recommended  She is scheduled today for US guided biopsy of the right neck lymph node.   No past medical history on file.  No past surgical history on file.  Allergies: Patient has no known allergies.  Medications: Prior to Admission medications   Medication Sig Start Date End Date Taking? Authorizing Provider  APPLE CIDER VINEGAR PO Take 1 capsule by mouth 2 (two) times daily.   Yes [provider]  Calcium-Phosphorus-Vitamin D (CITRACAL +D3 PO) Take 2 tablets by mouth daily.   Yes [provider]  Multiple Vitamin (MULTIVITAMIN) tablet Take 1 tablet by mouth daily. chewable   Yes [provider]  Sodium Hyaluronate, oral, (HYALURONIC ACID PO) Take 2 tablets by mouth daily. Collagen supplement   Yes [provider]  Turmeric 400 MG CAPS Take 2 capsules by mouth daily.   Yes [provider]  ibuprofen (ADVIL) 200 MG tablet Take 200 mg by mouth every 6 (six) hours as needed.    [provider]  nitroGLYCERIN (NITRODUR - DOSED IN MG/24 HR) 0.2 mg/hr patch Use 1/4 patch daily to the affected area Patient not taking: Reported on 07/02/2018 10/30/17   Enid Baas, MD     No family history on file.  Social History   Socioeconomic  History   Marital status: Married    Spouse name: Not on file   Number of children: Not on file   Years of education: Not on file   Highest education level: Not on file  Occupational History   Not on file  Tobacco Use   Smoking status: Never   Smokeless tobacco: Never  Substance and Sexual Activity   Alcohol use: Not on file   Drug use: Not on file   Sexual activity: Not on file  Other Topics Concern   Not on file  Social History Narrative   Not on file   Social Determinants of Health   Financial Resource Strain: Not on file  Food Insecurity: Not on file  Transportation Needs: Not on file  Physical Activity: Not on file  Stress: Not on file  Social Connections: Not on file      Review of Systems denies fever, HA, CP, dyspnea, cough, abd/back pain,N/V or bleeding  Vital Signs: BP 129/84   Pulse (!) 56   Temp 98.2 F (36.8 C) (Oral)   Resp 16   Physical Exam ;awake/alert; chest- CTA bilat; heart- RRR; abd- soft,+BS,NT; no LE edema  Imaging: CT SOFT TISSUE NECK W CONTRAST  Result Date: 03/28/2022 CLINICAL DATA:  Right-sided neck lump for 2 months. EXAM: CT NECK WITH CONTRAST TECHNIQUE: Multidetector CT imaging of the neck was performed using the standard protocol following the bolus administration of intravenous contrast. RADIATION DOSE REDUCTION: This exam was performed according to the departmental dose-optimization program which includes automated exposure control, adjustment of the  mA and/or kV according to patient size and/or use of iterative reconstruction technique. CONTRAST:  69mL ISOVUE-300 IOPAMIDOL (ISOVUE-300) INJECTION 61% COMPARISON:  None Available. FINDINGS: Pharynx and larynx: Calcifications at the anterior aspect of the left palatine tonsil and in the base of tongue. Slight asymmetric fullness of the right palatine tonsillar soft tissues without a discrete mass identified. Salivary glands: No inflammation, mass, or stone. Thyroid: Unremarkable. Lymph nodes:  16 x 10 mm right level IIa mass demonstrating central low density and peripheral enhancement likely reflecting an abnormal lymph node. No enlarged or suspicious lymph nodes elsewhere in the neck. Vascular: Major vascular structures of the neck are patent. Limited intracranial: Unremarkable. Visualized orbits: Unremarkable. Mastoids and visualized paranasal sinuses: Clear. Skeleton: Mild-to-moderate disc and moderate facet degeneration in the cervical spine. No suspicious osseous lesion. Upper chest: Clear lung apices. Other: None. IMPRESSION: 1. Abnormal, cystic appearing right level II lymph node, indeterminate although metastatic disease is a concern (versus an infectious etiology). 2. No definite primary neck mass is identified, however there is slight asymmetric fullness of the right palatine tonsil and direct visualization is recommended. Electronically Signed   By: Sebastian Ache M.D.   On: 03/28/2022 16:31    Labs:  CBC: Recent Labs    04/06/22 1140  WBC 4.7  HGB 15.1*  HCT 44.3  PLT 265    COAGS: Recent Labs    04/06/22 1140  INR 0.9    BMP: No results for input(s): "NA", "K", "CL", "CO2", "GLUCOSE", "BUN", "CALCIUM", "CREATININE", "GFRNONAA", "GFRAA" in the last 8760 hours.  Invalid input(s): "CMP"  LIVER FUNCTION TESTS: No results for input(s): "BILITOT", "AST", "ALT", "ALKPHOS", "PROT", "ALBUMIN" in the last 8760 hours.  TUMOR MARKERS: No results for input(s): "AFPTM", "CEA", "CA199", "CHROMGRNA" in the last 8760 hours.  Assessment and Plan: 56 y.o. female nonsmoker with no sig PMH who presents now with about 2 month hx right neck mass. No hx malignancy. CT performed on 03/28/22 revealed:  1. Abnormal, cystic appearing right level II lymph node, indeterminate although metastatic disease is a concern (versus an infectious etiology). 2. No definite primary neck mass is identified, however there is slight asymmetric fullness of the right palatine tonsil and  direct visualization is recommended  She is scheduled today for US guided biopsy of the right neck lymph node.Risks and benefits of procedure was discussed with the patient including, but not limited to bleeding, infection, damage to adjacent structures or low yield requiring additional tests or possible cancellation of biopsy due to small size of node  All of the questions were answered and there is agreement to proceed.  Consent signed and in chart.    Thank you for this interesting consult.  I greatly enjoyed meeting Sommer Spickard and look forward to participating in their care.  A copy of this report was sent to the requesting provider on this date.  Electronically Signed: D. Jeananne Rama, PA-C 04/06/2022, 12:17 PM   I spent a total of  20 minutes   in face to face in clinical consultation, greater than 50% of which was counseling/coordinating care for US guided biopsy of right neck lymph node

## 2022-04-10 LAB — CYTOLOGY - NON PAP

## 2022-04-20 ENCOUNTER — Other Ambulatory Visit (HOSPITAL_COMMUNITY)
Admission: RE | Admit: 2022-04-20 | Discharge: 2022-04-20 | Disposition: A | Discharge status: Home / Self Care | Payer: Commercial Managed Care - HMO | Source: Ambulatory Visit | Attending: Otolaryngology | Admitting: Otolaryngology

## 2022-04-21 LAB — SURGICAL PATHOLOGY

## 2023-07-19 ENCOUNTER — Ambulatory Visit: Payer: Commercial Managed Care - HMO | Admitting: Professional Counselor

## 2023-08-01 ENCOUNTER — Encounter: Payer: Self-pay | Admitting: Professional Counselor

## 2023-08-01 ENCOUNTER — Ambulatory Visit: Payer: 59 | Admitting: Professional Counselor

## 2023-08-01 DIAGNOSIS — F4322 Adjustment disorder with anxiety: Secondary | ICD-10-CM | POA: Diagnosis not present
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
## 2023-08-14 ENCOUNTER — Ambulatory Visit: Payer: 59 | Admitting: Professional Counselor

## 2023-08-29 ENCOUNTER — Encounter: Payer: Self-pay | Admitting: Professional Counselor

## 2023-08-29 ENCOUNTER — Ambulatory Visit: Payer: 59 | Admitting: Professional Counselor

## 2023-08-29 DIAGNOSIS — F4322 Adjustment disorder with anxiety: Secondary | ICD-10-CM | POA: Diagnosis not present

## 2023-08-29 NOTE — Progress Notes (Signed)
      [facility] Counselor/Therapist Progress Note  Patient ID: Alexandria Huffman, MRN: [id number],    Date: 08/29/2023  Time Spent: 1:09p - 2:08p  Treatment Type: Individual Therapy  Reported Symptoms: worries, preoccupying thoughts, frustration, interpersonal concerns   Mental Status Exam:  Appearance:   Casual     Behavior:  Appropriate, Sharing, and Motivated  Motor:  Normal  Speech/Language:   Clear and Coherent and Normal Rate  Affect:  Appropriate and Congruent  Mood:  normal  Thought process:  normal  Thought content:    WNL  Sensory/Perceptual disturbances:    WNL  Orientation:  Sound  Attention:  Good  Concentration:  Good  Memory:  WNL  Fund of knowledge:   Good  Insight:    Good  Judgment:   Good  Impulse Control:  Good   Risk Assessment: Danger to Self:  No Self-injurious Behavior: No Danger to Others: No Duty to Warn:no Physical Aggression / Violence:No  Access to Firearms a concern: No  Gang Involvement:No   Subjective: Pt presented to session to address symptoms of anxiety. Pt  and counselor discussed pt treatment plan and pt gave her consent; they continued to build rapport. Pt reported positive developments with mother's health and benefit to her well-being as a result due to lightening of stress and worry. Pt processed experience of marital strife and shared regarding hx of dynamics and patterns of concern. Counselor helped facilitate insight around empowering as opposed to enabling, and they discussed what that might look like and strategies to achieve. They also discussed how to nurture greater connection, and possibility of a couples counseling session, or couples counseling with an ongoing provider. Pt processed concerns for children as well. Pt identified STG to give back responsibility to others where appropriate and in small shifts, to allow space for natural consequences for others, to be mindful of creating space for growing edges for  others.  Interventions: Solution-Oriented/Positive Psychology, Humanistic/Existential, and Insight-Oriented  Diagnosis:   ICD-10-CM   1. Adjustment disorder with anxiety  F43.22       Plan: Pt is scheduled for a follow-up; continue process work and developing coping skills. Pt to practice STG between sessions as described above.   Gaspar Bidding, [facility]

## 2023-09-04 ENCOUNTER — Encounter: Payer: Self-pay | Admitting: Professional Counselor

## 2023-09-11 ENCOUNTER — Encounter: Payer: Self-pay | Admitting: Professional Counselor

## 2023-09-11 ENCOUNTER — Ambulatory Visit: Payer: 59 | Admitting: Professional Counselor

## 2023-09-11 DIAGNOSIS — F4322 Adjustment disorder with anxiety: Secondary | ICD-10-CM

## 2023-09-11 NOTE — Progress Notes (Signed)
      [facility] Counselor/Therapist Progress Note  Patient ID: Alexandria Huffman, MRN: [id number],    Date: 09/11/2023  Time Spent: 2:08 PM to 3:08 PM  Treatment Type: Individual Therapy  Reported Symptoms: tearfulness, worries, stress, restlessness, interpersonal concerns  Mental Status Exam:  Appearance:   Casual  Behavior:  Appropriate, Sharing, and Motivated  Motor:  Normal  Speech/Language:   Clear and Coherent and Normal Rate  Affect:  Appropriate and Congruent  Mood:  normal  Thought process:  normal  Thought content:    WNL  Sensory/Perceptual disturbances:    WNL  Orientation:  Sound  Attention:  Good  Concentration:  Good  Memory:  WNL  Fund of knowledge:   Good  Insight:    Good  Judgment:   Good  Impulse Control:  Good   Risk Assessment: Danger to Self:  No Self-injurious Behavior: No Danger to Others: No Duty to Warn:no Physical Aggression / Violence:No  Access to Firearms a concern: No  Gang Involvement:No   Subjective: Patient presented to session to address concerns of anxiety.  Patient reported limited progress since last session.  She processed experience of blended family and interpersonal concerns within the family system.  Patient identified her caregiving role for others and how she can sometimes overcompensate for their needs.  Counselor help facilitate insight around pattern, and patient need to find restoration for herself alongside serving others.  Counselor and patient discussed patient plan for healthy engagement in relationship and ways to preemptively make plans and to connect where there is distance.  Patient processed frustration with spouse and issues of avoidance, and her tendency to avoid conflict.  Counselor and patient discussed strategies for improved communication and meeting of emotional needs as identified short term goal between sessions.  Interventions: Solution-Oriented/Positive Psychology, Humanistic/Existential, and  Insight-Oriented  Diagnosis:   ICD-10-CM   1. Adjustment disorder with anxiety  F43.22       Plan: Patient is scheduled for a follow-up.  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  Patient to practice short-term goal between sessions as described above.  Progress note was dictated via Dragon and checked for accuracy.  Gaspar Bidding, [facility]

## 2023-09-25 ENCOUNTER — Encounter: Payer: Self-pay | Admitting: Professional Counselor

## 2023-09-25 ENCOUNTER — Ambulatory Visit (INDEPENDENT_AMBULATORY_CARE_PROVIDER_SITE_OTHER): Payer: Commercial Managed Care - HMO | Admitting: Professional Counselor

## 2023-09-25 DIAGNOSIS — F4322 Adjustment disorder with anxiety: Secondary | ICD-10-CM

## 2023-09-25 NOTE — Progress Notes (Signed)
      [facility] Counselor/Therapist Progress Note  Patient ID: Alexandria Huffman, MRN: [id number],    Date: 09/25/2023  Time Spent: 2:09 PM to 3:08 PM  Treatment Type: Individual Therapy  Reported Symptoms: worries, preoccupying thoughts, restlessness, stress, interpersonal concerns  Mental Status Exam:  Appearance:   Casual     Behavior:  Appropriate, Sharing, and Motivated  Motor:  Normal  Speech/Language:   Clear and Coherent and Normal Rate  Affect:  Appropriate and Congruent  Mood:  normal  Thought process:  normal  Thought content:    WNL  Sensory/Perceptual disturbances:    WNL  Orientation:  Sound  Attention:  Good  Concentration:  Good  Memory:  WNL  Fund of knowledge:   Good  Insight:    Good  Judgment:   Good  Impulse Control:  Good   Risk Assessment: Danger to Self:  No Self-injurious Behavior: No Danger to Others: No Duty to Warn:no Physical Aggression / Violence:No  Access to Firearms a concern: No  Gang Involvement:No   Subjective: Patient presented to session to address concerns of anxiety.  She voiced limited progress at this time.  Patient continued to process experience of marital and family concerns causing her intermittent distress.  She voiced frustration with circumstances and dynamics, and reflected on her feelings and experience with counselor.  Counselor assisted patient with strategies to improve communications and to voice her needs.  Counselor affirmed patient.  She helped patient identify a short-term goal between session.  Patient identified STG to discuss with husband/to prioritize a time each weekend where patient and husband spend quality time together.  Interventions: Solution-Oriented/Positive Psychology, Humanistic/Existential, and Insight-Oriented, Assertiveness Skill-Building  Diagnosis:   ICD-10-CM   1. Adjustment disorder with anxiety  F43.22       Plan: Patient is scheduled for follow-up; continue process work and developing  coping skills.  Patient to address short-term goal between sessions as identified above.  Progress note was dictated via Dragon and reviewed for accuracy.  Gaspar Bidding, [facility]

## 2023-10-09 ENCOUNTER — Ambulatory Visit (INDEPENDENT_AMBULATORY_CARE_PROVIDER_SITE_OTHER): Payer: Commercial Managed Care - HMO | Admitting: Professional Counselor

## 2023-10-09 ENCOUNTER — Encounter: Payer: Self-pay | Admitting: Professional Counselor

## 2023-10-09 DIAGNOSIS — F4322 Adjustment disorder with anxiety: Secondary | ICD-10-CM

## 2023-10-09 NOTE — Progress Notes (Unsigned)
      [facility] Counselor/Therapist Progress Note  Patient ID: Alexandria Huffman, MRN: [id number],    Date: 10/09/2023  Time Spent: ***   Treatment Type: Individual Therapy  Reported Symptoms: ***  Mental Status Exam:  Appearance:   Casual     Behavior:  Appropriate, Sharing, and Motivated  Motor:  Normal  Speech/Language:   Clear and Coherent and Normal Rate  Affect:  Appropriate and Congruent  Mood:  normal  Thought process:  normal  Thought content:    WNL  Sensory/Perceptual disturbances:    WNL  Orientation:  Sound  Attention:  Good  Concentration:  Good  Memory:  WNL  Fund of knowledge:   Good  Insight:    Good  Judgment:   Good  Impulse Control:  Good   Risk Assessment: Danger to Self:  No Self-injurious Behavior: No Danger to Others: No Duty to Warn:no Physical Aggression / Violence:No  Access to Firearms a concern: No  Gang Involvement:No   Subjective: ***   Interventions: Assertiveness/Communication, Solution-Oriented/Positive Psychology, Humanistic/Existential, and Insight-Oriented  Diagnosis:   ICD-10-CM   1. Adjustment disorder with anxiety  F43.22       Plan: ***  Gaspar Bidding, [facility]

## 2023-10-23 ENCOUNTER — Encounter: Payer: Self-pay | Admitting: Professional Counselor

## 2023-10-23 ENCOUNTER — Ambulatory Visit (INDEPENDENT_AMBULATORY_CARE_PROVIDER_SITE_OTHER): Payer: Commercial Managed Care - HMO | Admitting: Professional Counselor

## 2023-10-23 DIAGNOSIS — F4322 Adjustment disorder with anxiety: Secondary | ICD-10-CM

## 2023-10-23 NOTE — Progress Notes (Signed)
      [facility] Counselor/Therapist Progress Note  Patient ID: Alexandria Huffman, MRN: [id number],    Date: 10/23/2023  Time Spent: 2:06 PM to 3:08 PM  Treatment Type: Individual Therapy  Reported Symptoms: Worries, preoccupying thoughts, stress, phase of life concerns, interpersonal concerns  Mental Status Exam:  Appearance:   Casual     Behavior:  Appropriate and Sharing  Motor:  Normal  Speech/Language:   Clear and Coherent and Normal Rate  Affect:  Appropriate and Congruent  Mood:  normal  Thought process:  normal  Thought content:    WNL  Sensory/Perceptual disturbances:    WNL  Orientation:  oriented to person, place, time/date, and situation  Attention:  Good  Concentration:  Good  Memory:  WNL  Fund of knowledge:   Good  Insight:    Good  Judgment:   Good  Impulse Control:  Good   Risk Assessment: Danger to Self:  No Self-injurious Behavior: No Danger to Others: No Duty to Warn:no Physical Aggression / Violence:No  Access to Firearms a concern: No  Gang Involvement:No   Subjective: Patient presented to session to address concerns of anxiety.  She reported mixed progress at this time.  She reported the holidays to have gone well with kids at home, however to have some difficulties where enforcing rules of the house were concerned, and friction with husband.  Patient identified progress in having empowered husband to help in matter of concern where she might normally have taken the responsibility upon herself.  Patient processed her experience of frustration with marital dynamics and patterns, and reflected on her feelings and discernment around ways to improve quality of life.  Counselor actively listened, helped facilitate insight and to develop strategies for improvement in marital communications.  Counselor continue to recommend couples counseling for patient and spouse.  Interventions: Solution-Oriented/Positive Psychology, Humanistic/Existential, and  Insight-Oriented  Diagnosis:   ICD-10-CM   1. Adjustment disorder with anxiety  F43.22       Plan: Patient is scheduled for follow-up; continue process work and developing coping skills.  Patient to work on short-term goal between sessions of continuing to empower other family members to take on responsibilities, and continuing to reflect on her needs and ways to have them met.  Progress note was dictated with Dragon and reviewed for accuracy.  Alexandria Huffman, [facility]

## 2023-11-06 ENCOUNTER — Ambulatory Visit: Payer: 59 | Admitting: Professional Counselor

## 2023-11-06 DIAGNOSIS — Z0389 Encounter for observation for other suspected diseases and conditions ruled out: Secondary | ICD-10-CM

## 2023-11-06 NOTE — Progress Notes (Signed)
 Patient did not show for scheduled appointment. No message.  Bartolo Darter, [facility]

## 2023-11-16 ENCOUNTER — Ambulatory Visit: Payer: 59 | Admitting: Professional Counselor

## 2023-11-20 ENCOUNTER — Ambulatory Visit: Payer: 59 | Admitting: Professional Counselor

## 2023-12-03 ENCOUNTER — Encounter: Payer: Self-pay | Admitting: Professional Counselor

## 2023-12-03 ENCOUNTER — Ambulatory Visit (INDEPENDENT_AMBULATORY_CARE_PROVIDER_SITE_OTHER): Payer: Commercial Managed Care - HMO | Admitting: Professional Counselor

## 2023-12-03 DIAGNOSIS — F4322 Adjustment disorder with anxiety: Secondary | ICD-10-CM

## 2023-12-03 NOTE — Progress Notes (Signed)
      [facility] Counselor/Therapist Progress Note  Patient ID: Alexandria Huffman, MRN: [id number],    Date: 12/03/2023  Time Spent: 4:08 PM to 5:06 PM  Treatment Type: Individual Therapy  Reported Symptoms: Stress, worries, irritability, interpersonal concerns  Mental Status Exam:  Appearance:   Casual     Behavior:  Appropriate, Sharing, and Motivated  Motor:  Normal  Speech/Language:   Clear and Coherent and Normal Rate  Affect:  Appropriate and Congruent  Mood:  normal  Thought process:  normal  Thought content:    WNL  Sensory/Perceptual disturbances:    WNL  Orientation:  oriented to person, place, time/date, and situation  Attention:  Good  Concentration:  Good  Memory:  WNL  Fund of knowledge:   Good  Insight:    Good  Judgment:   Good  Impulse Control:  Good   Risk Assessment: Danger to Self:  No Self-injurious Behavior: No Danger to Others: No Duty to Warn:no Physical Aggression / Violence:No  Access to Firearms a concern: No  Gang Involvement:No   Subjective: Patient presented to session to address concerns of anxiety.  She voiced progress.  She processed experience of having had the flu since last session, and her experience of the holidays that were positive.  She voiced a significant development and her marital life with her husband having decided to step back from his consulting work and golf more, which she said helps ease the tension between them and have him more involved in family life.  She processed experience of their home life together and reasons why she feels tension and anxiety increases when it does.  Patient processed experience of issues going on with one of her sons, and developments with her mother and her health.  She voiced great improvement with relationship with her daughter-in-law due to work on Solicitor.  Counselor actively listened, affirmed patient feelings and experience, held space for processing, helped  facilitate insight, and helped patient develop strategies for improving marital relationship.  Interventions: Solution-Oriented/Positive Psychology, Humanistic/Existential, and Insight-Oriented  Diagnosis:   ICD-10-CM   1. Adjustment disorder with anxiety  F43.22       Plan: Patient is scheduled for a follow-up; continue process work and developing coping skills.  Patient short-term goal between sessions to work on strategies to improve marital relationship, including prioritizing more quality time with husband.  Progress note was dictated with Dragon and reviewed for accuracy.  Gaspar Bidding, [facility]

## 2023-12-20 ENCOUNTER — Ambulatory Visit: Payer: Self-pay | Admitting: Professional Counselor

## 2024-01-09 ENCOUNTER — Other Ambulatory Visit (HOSPITAL_COMMUNITY): Payer: Self-pay | Admitting: Obstetrics and Gynecology

## 2024-01-09 DIAGNOSIS — Z8249 Family history of ischemic heart disease and other diseases of the circulatory system: Secondary | ICD-10-CM

## 2024-01-14 ENCOUNTER — Ambulatory Visit: Payer: Self-pay | Admitting: Professional Counselor

## 2024-01-14 ENCOUNTER — Encounter: Payer: Self-pay | Admitting: Professional Counselor

## 2024-01-14 DIAGNOSIS — F4322 Adjustment disorder with anxiety: Secondary | ICD-10-CM | POA: Diagnosis not present

## 2024-01-14 NOTE — Progress Notes (Signed)
      [facility] Counselor/Therapist Progress Note  Patient ID: Alexandria Huffman, MRN: [id number],    Date: 01/14/2024  Time Spent: 2:14 PM to 3:10 PM  Treatment Type: Individual Therapy  Reported Symptoms: Worries, stress, preemptive grief/loss, interpersonal concerns  Mental Status Exam:  Appearance:   Casual     Behavior:  Appropriate, Sharing, and Motivated  Motor:  Normal  Speech/Language:   Clear and Coherent and Normal Rate  Affect:  Appropriate and Congruent  Mood:  normal  Thought process:  normal  Thought content:    WNL  Sensory/Perceptual disturbances:    WNL  Orientation:  oriented to person, place, time/date, and situation  Attention:  Good  Concentration:  Good  Memory:  WNL  Fund of knowledge:   Good  Insight:    Good  Judgment:   Good  Impulse Control:  Good   Risk Assessment: Danger to Self:  No Self-injurious Behavior: No Danger to Others: No Duty to Warn:no Physical Aggression / Violence:No  Access to Firearms a concern: No  Gang Involvement:No   Subjective: Patient presented to session to address concerns of anxiety.  She reported progress.  Patient processed developments with her parents who are aging and have health concerns, and also processed developments in marital relationship.  She voiced her husband to have stopped consulting, to be more present, and to being more attentive to his health.  She also processed developments with her sons and daughter-in-law.  Patient identified working to empower others to show up for themselves so that she is not over functioning for others.  Counselor actively listened, affirmed patient feelings and experience, and helped to facilitate insight and reinforce areas of progress.  Patient identified desire to reduce drinking and drinking related eating, and counselor assisted with strategies such as setting a timer between cravings, and utilizing healthy substitutions.  Interventions: Solution-Oriented/Positive  Psychology, Humanistic/Existential, and Insight-Oriented  Diagnosis:   ICD-10-CM   1. Adjustment disorder with anxiety  F43.22       Plan: Patient is scheduled for follow-up; continue process work and developing coping skills.  Patient short-term goal between sessions to work on eating and drinking reduction strategies as described above.  Progress note was dictated with Dragon and reviewed for accuracy.  Anthon Kins, [facility]

## 2024-03-13 ENCOUNTER — Ambulatory Visit (INDEPENDENT_AMBULATORY_CARE_PROVIDER_SITE_OTHER): Admitting: Professional Counselor

## 2024-03-13 ENCOUNTER — Encounter: Payer: Self-pay | Admitting: Professional Counselor

## 2024-03-13 DIAGNOSIS — F4322 Adjustment disorder with anxiety: Secondary | ICD-10-CM

## 2024-03-13 NOTE — Progress Notes (Signed)
      [facility] Counselor/Therapist Progress Note  Patient ID: Alexandria Huffman, MRN: [id number],    Date: 03/13/2024  Time Spent: 4:10 PM to 5:09 PM  Treatment Type: Individual Therapy  Reported Symptoms: Worries, restlessness, family concerns, some stress, phase of life concerns, interpersonal concerns  Mental Status Exam:  Appearance:   Casual     Behavior:  Appropriate, Sharing, and Motivated  Motor:  Normal  Speech/Language:   Clear and Coherent and Normal Rate  Affect:  Appropriate and Congruent  Mood:  normal  Thought process:  normal  Thought content:    WNL  Sensory/Perceptual disturbances:    WNL  Orientation:  oriented to person, place, time/date, and situation  Attention:  Good  Concentration:  Good  Memory:  WNL  Fund of knowledge:   Good  Insight:    Good  Judgment:   Good  Impulse Control:  Good   Risk Assessment: Danger to Self:  No Self-injurious Behavior: No Danger to Others: No Duty to Warn:no Physical Aggression / Violence:No  Access to Firearms a concern: No  Gang Involvement:No   Subjective: Patient presented to session to address concerns of anxiety.  She reported progress at this time.  Patient identified continuing to worry about her father and his health, wellbeing and attitude, however she voiced positive developments with family matters that helped mitigate overall stress.  Patient processed experience of taking a trip across country with her son to his work destination, which was a Proofreader experience for them, and she expressed missing him, but being happy for his trajectory.  Patient identified having been taking care of her health and her personal life in recent months, and feeling much improved across spheres of life and in relationships.  Counselor and patient discussed patient significant progress, and patient scheduling for follow-up if she has need in the future.  Interventions: Solution-Oriented/Positive Psychology,  Humanistic/Existential, and Insight-Oriented  Diagnosis:   ICD-10-CM   1. Adjustment disorder with anxiety  F43.22       Plan: Patient follow-up to be determined.  Patient to continue wellness strategies practiced during treatment including advocating for her needs, prioritizing self-care, utilizing interpersonal effectiveness skills.  Progress note was dictated with Dragon and reviewed for accuracy.  Alexandria Huffman, [facility]

## 8387-06-24 DEATH — deceased
